# Patient Record
Sex: Male | Born: 1945 | Race: White | Hispanic: No | Marital: Married | Smoking: Never smoker
Health system: Southern US, Community
[De-identification: ages and names within clinical notes are randomized; demographics above are authoritative.]

## PROBLEM LIST (undated history)

## (undated) DIAGNOSIS — I10 Essential (primary) hypertension: Secondary | ICD-10-CM

## (undated) DIAGNOSIS — I4891 Unspecified atrial fibrillation: Secondary | ICD-10-CM

## (undated) DIAGNOSIS — Z87442 Personal history of urinary calculi: Secondary | ICD-10-CM

## (undated) DIAGNOSIS — T7840XA Allergy, unspecified, initial encounter: Secondary | ICD-10-CM

## (undated) DIAGNOSIS — J45909 Unspecified asthma, uncomplicated: Secondary | ICD-10-CM

## (undated) DIAGNOSIS — I5032 Chronic diastolic (congestive) heart failure: Secondary | ICD-10-CM

## (undated) DIAGNOSIS — C801 Malignant (primary) neoplasm, unspecified: Secondary | ICD-10-CM

## (undated) DIAGNOSIS — I499 Cardiac arrhythmia, unspecified: Secondary | ICD-10-CM

## (undated) DIAGNOSIS — E66811 Obesity, class 1: Secondary | ICD-10-CM

## (undated) DIAGNOSIS — I509 Heart failure, unspecified: Secondary | ICD-10-CM

## (undated) DIAGNOSIS — R609 Edema, unspecified: Secondary | ICD-10-CM

## (undated) HISTORY — DX: Unspecified atrial fibrillation: I48.91

## (undated) HISTORY — PX: OTHER SURGICAL HISTORY: SHX169

## (undated) HISTORY — DX: Allergy, unspecified, initial encounter: T78.40XA

## (undated) HISTORY — DX: Unspecified asthma, uncomplicated: J45.909

## (undated) HISTORY — DX: Heart failure, unspecified: I50.9

## (undated) HISTORY — PX: TONSILLECTOMY AND ADENOIDECTOMY: SUR1326

## (undated) HISTORY — DX: Essential (primary) hypertension: I10

---

## 2010-06-02 ENCOUNTER — Emergency Department (HOSPITAL_COMMUNITY): Admission: EM | Admit: 2010-06-02 | Discharge: 2010-06-02 | Payer: Self-pay | Admitting: Emergency Medicine

## 2010-12-22 LAB — URINALYSIS, ROUTINE W REFLEX MICROSCOPIC
Glucose, UA: NEGATIVE mg/dL
Ketones, ur: NEGATIVE mg/dL
Protein, ur: 30 mg/dL — AB
Urobilinogen, UA: 1 mg/dL (ref 0.0–1.0)
pH: 6.5 (ref 5.0–8.0)

## 2010-12-22 LAB — URINE CULTURE: Colony Count: NO GROWTH

## 2010-12-22 LAB — BASIC METABOLIC PANEL
CO2: 28 mEq/L (ref 19–32)
Chloride: 101 mEq/L (ref 96–112)
Creatinine, Ser: 0.73 mg/dL (ref 0.4–1.5)
GFR calc non Af Amer: >60 mL/min (ref >60)

## 2010-12-22 LAB — URINE MICROSCOPIC-ADD ON

## 2010-12-22 LAB — DIFFERENTIAL
Eosinophils Relative: 0 % (ref 0–5)
Monocytes Absolute: 0.3 10*3/uL (ref 0.1–1.0)

## 2010-12-22 LAB — CBC
HCT: 32.8 % — ABNORMAL LOW (ref 39.0–52.0)
MCH: 32.4 pg (ref 26.0–34.0)
WBC: 9.4 10*3/uL (ref 4.0–10.5)

## 2012-01-23 ENCOUNTER — Ambulatory Visit (INDEPENDENT_AMBULATORY_CARE_PROVIDER_SITE_OTHER): Payer: Medicare Other | Admitting: Family Medicine

## 2012-01-23 VITALS — BP 171/98 | HR 73 | Temp 97.5°F | Resp 18 | Ht 69.0 in | Wt 230.0 lb

## 2012-01-23 DIAGNOSIS — D649 Anemia, unspecified: Secondary | ICD-10-CM

## 2012-01-23 DIAGNOSIS — I10 Essential (primary) hypertension: Secondary | ICD-10-CM

## 2012-01-23 DIAGNOSIS — D509 Iron deficiency anemia, unspecified: Secondary | ICD-10-CM

## 2012-01-23 DIAGNOSIS — E663 Overweight: Secondary | ICD-10-CM

## 2012-01-23 DIAGNOSIS — M25569 Pain in unspecified knee: Secondary | ICD-10-CM

## 2012-01-23 LAB — POCT CBC
MCH, POC: 30.2 pg (ref 27–31.2)
MCHC: 32 g/dL (ref 31.8–35.4)
MCV: 94.2 fL (ref 80–97)
POC Granulocyte: 3.4 (ref 2–6.9)
POC LYMPH PERCENT: 30.2 %L (ref 10–50)
Platelet Count, POC: 209 10*3/uL (ref 142–424)
RBC: 4.37 M/uL — AB (ref 4.69–6.13)
RDW, POC: 13.6 %

## 2012-01-23 MED ORDER — LISINOPRIL-HYDROCHLOROTHIAZIDE 10-12.5 MG PO TABS: 1.0000 | ORAL_TABLET | Freq: Every day | ORAL | Status: DC

## 2012-01-23 NOTE — Progress Notes (Signed)
Patient Name: Daniel Underwood Date of Birth: 08/17/1946 Medical Record Number: [id number] Gender: male Date of Encounter: 01/23/2012  History of Present Illness:  Daniel Underwood is a 66 y.o. very pleasant male patient who presents with the following:  Here today to discuss two issues: he took his BP the other day at a friend's home and it was about 170/ 107.  He has never been treated or formally diagnosed with HTN in the past.  His mother had a history of HTN.  He had some syncopal episoedes  In the past- was diagnosed with a vagal response.  He was put on dilatin but he stopped taking this years ago.  He has not had a syncopal episode in about 20 years.    Daniel Underwood has been line- dancing a lot recently and developed some left knee pain.  He had tried some OTC medications.  He notes that his knee hurts once in a while now- he is not dancing as much now.  No locking, no instability.  He uses ibuprofen sometimes for this pain.  Daniel Underwood also admits that he has put on 20- 25 lbs over the last couple of years.  He is not exercising as much.  He is aware that his weight gain is not helping his BP or his knee  There is no problem list on file for this patient.  No past medical history on file. No past surgical history on file. History  Substance Use Topics  . Smoking status: Never Smoker   . Smokeless tobacco: Not on file  . Alcohol Use: Not on file   No family history on file. Allergies  Allergen Reactions  . Aleve Other (See Comments)    Sweating and rapid heart beat    Medication list has been reviewed and updated.  Review of Systems: As per HPI- otherwise negative. Of note no CP, no SOB  Physical Examination: Filed Vitals:   01/23/12 0933  BP: 171/98  Pulse: 73  Temp: 97.5 F (36.4 C)  TempSrc: Oral  Resp: 18  Height: 5\' 9"  (1.753 m)  Weight: 230 lb (104.327 kg)    Body mass index is 33.96 kg/(m^2).  GEN: WDWN, NAD, Non-toxic, A & O x 3, obese HEENT: Atraumatic, Normocephalic.  Neck supple. No masses, No LAD.  TM, oropharynx wnl, PEERL Ears and Nose: No external deformity. CV: RRR, No M/G/R. No JVD. No thrill. No extra heart sounds. PULM: CTA B, no wheezes, crackles, rhonchi. No retractions. No resp. distress. No accessory muscle use. ABD: S, NT, ND, +BS. No rebound. No HSM. Knees: no swelling or crepitus bilaterally, no effusion.  Left knee in particular is stable, no ligamentous instability EXTR: No c/c/e NEURO Normal gait.  PSYCH: Normally interactive. Conversant. Not depressed or anxious appearing.  Calm demeanor. ;   Results for orders placed in visit on 01/23/12  POCT CBC      Component Value Range   WBC 5.4  4.6 - 10.2 (K/uL)   Lymph, poc 1.6  0.6 - 3.4    POC LYMPH PERCENT 30.2  10 - 50 (%L)   MID (cbc) 0.4  0 - 0.9    POC MID % 6.8  0 - 12 (%M)   POC Granulocyte 3.4  2 - 6.9    Granulocyte percent 63.0  37 - 80 (%G)   RBC 4.37 (*) 4.69 - 6.13 (M/uL)   Hemoglobin 13.2 (*) 14.1 - 18.1 (g/dL)   HCT, POC 40.9 (*) 81.1 - 53.7 (%)  MCV 94.2  80 - 97 (fL)   MCH, POC 30.2  27 - 31.2 (pg)   MCHC 32.0  31.8 - 35.4 (g/dL)   RDW, POC 06.3     Platelet Count, POC 209  142 - 424 (K/uL)   MPV 8.9  0 - 99.8 (fL)   EKG: sinus rhythm, no concerning changes Assessment and Plan: 1. Essential hypertension, benign  EKG 12-Lead, POCT CBC, Comprehensive metabolic panel, lisinopril-hydrochlorothiazide (PRINZIDE,ZESTORETIC) 10-12.5 MG per tablet  2. Overweight    3. Knee pain    4. Anemia     Start lisinopril/ hctz for HTN.  Also urged him to work on losing the 20 lbs he has gained as this may resolve his HTN.  He had normal BP when he weighed about 205 lbs.  Asked Daniel Underwood to check his BP at home (he has his own cuff) a few times weekly and come in for a recheck in about 3 weeks.   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  Suggest tylenol as opposed to ibuprofen due to HTN.  Weight loss will help here too Anemia, mild: never had colonoscopy.  Urged him to arrange this test asap and gave  some community resources. Explained risk of colon cancer and benefit of early detection.

## 2012-01-23 NOTE — Patient Instructions (Addendum)
Wear and Tear Disorders of the Knee (Arthritis, Osteoarthritis) Everyone will experience wear and tear injuries (arthritis, osteoarthritis) of the knee. These are the changes we all get as we age. They come from the joint stress of daily living. The amount of cartilage damage in your knee and your symptoms determine if you need surgery. Mild problems require approximately two months recovery time. More severe problems take several months to recover. With mild problems, your surgeon may find worn and rough cartilage surfaces. With severe changes, your surgeon may find cartilage that has completely worn away and exposed the bone. Loose bodies of bone and cartilage, bone spurs (excess bone growth), and injuries to the menisci (cushions between the large bones of your leg) are also common. All of these problems can cause pain. For a mild wear and tear problem, rough cartilage may simply need to be shaved and smoothed. For more severe problems with areas of exposed bone, your surgeon may use an instrument for roughing up the bone surfaces to stimulate new cartilage growth. Loose bodies are usually removed. Torn menisci may be trimmed or repaired. ABOUT THE ARTHROSCOPIC PROCEDURE Arthroscopy is a surgical technique. It allows your orthopedic surgeon to diagnose and treat your knee injury with accuracy. The surgeon looks into your knee through a small scope. The scope is like a small (pencil-sized) telescope. Arthroscopy is less invasive than open knee surgery. You can expect a more rapid recovery. After the procedure, you will be moved to a recovery area until most of the effects of the medication have worn off. Your caregiver will discuss the test results with you. RECOVERY The severity of the arthritis and the type of procedure performed will determine recovery time. Other important factors include age, physical condition, medical conditions, and the type of rehabilitation program. Strengthening your muscles after  arthroscopy helps guarantee a better recovery. Follow your caregiver's instructions. Use crutches, rest, elevate, ice, and do knee exercises as instructed. Your caregivers will help you and instruct you with exercises and other physical therapy required to regain your mobility, muscle strength, and functioning following surgery. Only take over-the-counter or prescription medicines for pain, discomfort, or fever as directed by your caregiver.  SEEK MEDICAL CARE IF:   There is increased bleeding (more than a small spot) from the wound.   You notice redness, swelling, or increasing pain in the wound.   Pus is coming from wound.   You develop an unexplained oral temperature above 102 F (38.9 C) , or as your caregiver suggests.   You notice a foul smell coming from the wound or dressing.   You have severe pain with motion of the knee.  SEEK IMMEDIATE MEDICAL CARE IF:   You develop a rash.   You have difficulty breathing.   You have any allergic problems.  MAKE SURE YOU:   Understand these instructions.   Will watch your condition.   Will get help right away if you are not doing well or get worse.  Document Released: 09/21/2000 Document Revised: 09/13/2011 Document Reviewed: 02/18/2008 [facility] Patient Information 2012 [place], [place].   Please start your blood pressure medication that we prescribed for you today.  Check your BP with your home cuff 2 or 3 times per week.  Write down your readings and come see [place] in about 3 weeks.    You should schedule a colonoscopy to help screen for and prevent colon cancer.  Any GI doctor in town can help you with this.  Some good options  are [facility] or [facility] (Dr. Loreta Ave and Dr. Elnoria Howard)

## 2012-01-24 ENCOUNTER — Encounter: Payer: Self-pay | Admitting: Family Medicine

## 2012-01-24 LAB — COMPREHENSIVE METABOLIC PANEL
Albumin: 4 g/dL (ref 3.5–5.2)
CO2: 21 mEq/L (ref 19–32)
Chloride: 107 mEq/L (ref 96–112)
Potassium: 4.2 mEq/L (ref 3.5–5.3)
Total Protein: 6.9 g/dL (ref 6.0–8.3)

## 2012-02-05 NOTE — Progress Notes (Signed)
Spoke with patient and let him know that ekg was normal.

## 2012-10-08 DIAGNOSIS — I482 Chronic atrial fibrillation, unspecified: Secondary | ICD-10-CM

## 2012-10-08 HISTORY — DX: Chronic atrial fibrillation, unspecified: I48.20

## 2013-01-27 ENCOUNTER — Ambulatory Visit (INDEPENDENT_AMBULATORY_CARE_PROVIDER_SITE_OTHER): Payer: Medicare Other | Admitting: Family Medicine

## 2013-01-27 ENCOUNTER — Ambulatory Visit: Payer: Medicare Other

## 2013-01-27 ENCOUNTER — Telehealth: Payer: Self-pay

## 2013-01-27 VITALS — BP 132/94 | HR 86 | Temp 98.0°F | Resp 16 | Ht 69.5 in | Wt 235.6 lb

## 2013-01-27 DIAGNOSIS — I4891 Unspecified atrial fibrillation: Secondary | ICD-10-CM

## 2013-01-27 DIAGNOSIS — M25569 Pain in unspecified knee: Secondary | ICD-10-CM

## 2013-01-27 DIAGNOSIS — M25561 Pain in right knee: Secondary | ICD-10-CM

## 2013-01-27 DIAGNOSIS — I499 Cardiac arrhythmia, unspecified: Secondary | ICD-10-CM

## 2013-01-27 LAB — POCT CBC
Granulocyte percent: 58.3 %G (ref 37–80)
MPV: 8.4 fL (ref 0–99.8)
POC LYMPH PERCENT: 31.9 %L (ref 10–50)
Platelet Count, POC: 232 10*3/uL (ref 142–424)
RBC: 4.71 M/uL (ref 4.69–6.13)
RDW, POC: 14.5 %
WBC: 7.2 10*3/uL (ref 4.6–10.2)

## 2013-01-27 LAB — POCT GLYCOSYLATED HEMOGLOBIN (HGB A1C): Hemoglobin A1C: 5.5

## 2013-01-27 LAB — BASIC METABOLIC PANEL
CO2: 28 mEq/L (ref 19–32)
Chloride: 106 mEq/L (ref 96–112)
Creat: 0.71 mg/dL (ref 0.50–1.35)

## 2013-01-27 MED ORDER — TRAMADOL HCL 50 MG PO TABS: 50.0000 mg | ORAL_TABLET | Freq: Two times a day (BID) | ORAL | Status: DC | PRN

## 2013-01-27 MED ORDER — ASPIRIN 81 MG PO TBEC: 81.0000 mg | DELAYED_RELEASE_TABLET | Freq: Every day | ORAL | Status: DC

## 2013-01-27 NOTE — Progress Notes (Signed)
[facility] [address] [place] [address] [id number]  Date:  01/27/2013   Name:  Daniel Underwood   DOB:  1946-05-07   MRN:  [id number]  PCP:  Dois Davenport., MD    Chief Complaint: Knee Pain   History of Present Illness:  Daniel Underwood is a 67 y.o. very pleasant male patient who presents with the following:  He is here with left knee pain today.  He has noted this for about one year.  He has used some buffered aspirin and some topical cream, which helps some.  Over the last week he has had more pain than usual. It was especially bad a few days ago.  It is slightly swolllen.   No fall or other acute injury that he can recall. He has noted some clicking, but no instability.   He has never had any knee surgery  He has no history of atrial fibrillation in the past, no past cardiac problems.  Noted irregular rhythm on exam- he was not aware of any problem.  No CP, no syncope, no palpitations.   I put him on BP medication last year, but he did not take it for long.  Her reports he has been told he had pre- diabetes in the past, but never diagnosed with DM.    He reports an allergy to naproxen, but is able to tolerate ibuprofen and aspirin without a problem. He has occasional asthma, but no current symtoms.  He rarely uses his inhaler.  "Aspirin is what I take for pain" in general  There is no problem list on file for this patient.   Past Medical History  Diagnosis Date  . Allergy     History reviewed. No pertinent past surgical history.  History  Substance Use Topics  . Smoking status: Never Smoker   . Smokeless tobacco: Not on file  . Alcohol Use: Not on file    Family History  Problem Relation Age of Onset  . Hypertension Mother     Allergies  Allergen Reactions  . Naproxen Sodium Other (See Comments)    Sweating and rapid heart beat    Medication list has been reviewed and updated.  Current Outpatient Prescriptions on File Prior to  Visit  Medication Sig Dispense Refill  . fluticasone-salmeterol (ADVAIR HFA) 115-21 MCG/ACT inhaler Inhale 2 puffs into the lungs as needed.       Marland Kitchen lisinopril-hydrochlorothiazide (PRINZIDE,ZESTORETIC) 10-12.5 MG per tablet Take 1 tablet by mouth daily.  30 tablet  6   No current facility-administered medications on file prior to visit.    Review of Systems:  As per HPI- otherwise negative.   Physical Examination: Filed Vitals:   01/27/13 1157  BP: 132/94  Pulse: 86  Temp: 98 F (36.7 C)  Resp: 16   Filed Vitals:   01/27/13 1157  Height: 5' 9.5" (1.765 m)  Weight: 235 lb 9.6 oz (106.867 kg)   Body mass index is 34.3 kg/(m^2). Ideal Body Weight: Weight in (lb) to have BMI = 25: 171.4  GEN: WDWN, NAD, Non-toxic, A & O x 3, obese HEENT: Atraumatic, Normocephalic. Neck supple. No masses, No LAD. Ears and Nose: No external deformity. CV: irregularly irregular rhythm, No M/G/R. No JVD. No thrill. No extra heart sounds. PULM: CTA B, no wheezes, crackles, rhonchi. No retractions. No resp. distress. No accessory muscle use. ABD: S, NT, ND. No rebound. No HSM. EXTR: No c/c/e NEURO Normal gait.  PSYCH: Normally interactive.  Conversant. Not depressed or anxious appearing.  Calm demeanor.  Left knee: minimal medial joint line tenderness.  He has some pain with flexion and extension of the joint- mild, does not suggest gout or infection. Good ROM. No heat or redness.  Minimal to no swelling.  Joint is stable, no laxity  [facility] reading (PRIMARY) by  Dr. Patsy Lager. Left knee: mild degenerative change  LEFT KNEE - COMPLETE 4+ VIEW  Comparison: None.  Findings: There is slight medial joint space narrowing and there is slight marginal spur formation on the patella. No joint effusion. Probable small meniscal ossicle at the lateral aspect of the lateral joint space.  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   [facility] reading (PRIMARY) by   Dr. Patsy Lager. CXR: cardiomegaly, otherwise negative  CHEST - 2 VIEW  Comparison: 06/02/2010  Findings: The heart is mildly enlarged in size. The lungs are clear bilaterally. No bony abnormality is seen.  IMPRESSION: Stable cardiomegaly. No acute abnormality is noted.   Results for orders placed in visit on 01/27/13  POCT CBC      Result Value Range   WBC 7.2  4.6 - 10.2 K/uL   Lymph, poc 2.3  0.6 - 3.4   POC LYMPH PERCENT 31.9  10 - 50 %L   MID (cbc) 0.7  0 - 0.9   POC MID % 9.8  0 - 12 %M   POC Granulocyte 4.2  2 - 6.9   Granulocyte percent 58.3  37 - 80 %G   RBC 4.71  4.69 - 6.13 M/uL   Hemoglobin 14.2  14.1 - 18.1 g/dL   HCT, POC 78.2  95.6 - 53.7 %   MCV 97.0  80 - 97 fL   MCH, POC 30.1  27 - 31.2 pg   MCHC 31.1 (*) 31.8 - 35.4 g/dL   RDW, POC 21.3     Platelet Count, POC 232  142 - 424 K/uL   MPV 8.4  0 - 99.8 fL  POCT GLYCOSYLATED HEMOGLOBIN (HGB A1C)      Result Value Range   Hemoglobin A1C 5.5    GLUCOSE, POCT (MANUAL RESULT ENTRY)      Result Value Range   POC Glucose 87  70 - 99 mg/dl   EKG: atrial fibrillation with ventricular rate in the 70s  Discussed with Dr. Patty Sermons, DOD at [facility].  Daniel Underwood is in a fib with normal rate.  His CHADS2 score is zero. Will start on aspirin 81 mg a day- per pt he does NOT have any allergy to this medication. Will refer for follow- up with cardiology and for an echo.   Other labs pending as below  Assessment and Plan: Right knee pain - Plan: DG Knee Complete 4 Views Left, traMADol (ULTRAM) 50 MG tablet  Irregular heart rate - Plan: EKG 12-Lead  Atrial fibrillation - Plan: POCT CBC, TSH, Basic metabolic panel, POCT glycosylated hemoglobin (Hb A1C), Protime-INR, POCT glucose (manual entry), DG Chest 2 View, 2D Echocardiogram without contrast, Ambulatory referral to Cardiology, aspirin 81 MG EC tablet  Left knee pain- suspect pain due to mild OA, possible meniscal pathology.  Placed in hinged knee brace, ice and  elevate leg.  Use tramadol as needed for pain in the knee.  Cautioned regarding sedation with this medication.  Discussed need to avoid use if asthma symptoms.    New diagnosis/ incidental finding of atrial fibrillation.  Discussed in detail with patient. Will start on asa 81 mg, and  refer to cardiology and for echo as above.  If any changes in his symptoms he is to seek care right away.    Signed Abbe Amsterdam, MD

## 2013-01-27 NOTE — Telephone Encounter (Signed)
Pt was suppose to let Dr. Patsy Lager the name of a Dr. The Dr. Name is Marolyn Haller or Dickie La at [place]. If she has any questions he said for her to give him a call.

## 2013-01-27 NOTE — Patient Instructions (Addendum)
I will give you a call in a little while- please decide which cardiologist you would like for me to call.  We will then decide on a blood thinner for you. You may use the tramadol as needed for pain unless I tell you that we need to change this to something else.    You are in atrial fibrillation- this is the name of your cardiac rhythm abnormality

## 2013-01-27 NOTE — Telephone Encounter (Signed)
Dr Brodie 

## 2013-01-28 ENCOUNTER — Encounter: Payer: Self-pay | Admitting: Family Medicine

## 2013-01-28 ENCOUNTER — Telehealth: Payer: Self-pay | Admitting: Family Medicine

## 2013-01-28 LAB — PROTIME-INR: Prothrombin Time: 12.9 seconds (ref 11.6–15.2)

## 2013-01-28 NOTE — Telephone Encounter (Signed)
appt is set for tomorrow.  Referral is contacting pt

## 2013-01-29 ENCOUNTER — Ambulatory Visit (INDEPENDENT_AMBULATORY_CARE_PROVIDER_SITE_OTHER): Payer: Medicare Other | Admitting: Cardiology

## 2013-01-29 ENCOUNTER — Ambulatory Visit (HOSPITAL_COMMUNITY): Payer: Medicare Other | Attending: Cardiology | Admitting: Radiology

## 2013-01-29 ENCOUNTER — Encounter: Payer: Self-pay | Admitting: Cardiology

## 2013-01-29 VITALS — BP 142/90 | HR 72 | Ht 71.0 in | Wt 236.8 lb

## 2013-01-29 DIAGNOSIS — Z79899 Other long term (current) drug therapy: Secondary | ICD-10-CM

## 2013-01-29 DIAGNOSIS — I4891 Unspecified atrial fibrillation: Secondary | ICD-10-CM

## 2013-01-29 MED ORDER — RIVAROXABAN 20 MG PO TABS: 20.0000 mg | ORAL_TABLET | Freq: Every day | ORAL | Status: DC

## 2013-01-29 NOTE — Progress Notes (Signed)
Echocardiogram performed.  

## 2013-01-29 NOTE — Progress Notes (Signed)
HPI The patient recently saw his primary provider for evaluation of knee pain. He was noted to have an irregular rhythm. He had an EKG which demonstrated atrial fibrillation. He actually was not noticing this though in retrospect his girlfriend thinks that he had less tolerance for activities. He's not had any palpitations, presyncope or syncope. He's not had any new shortness of breath, PND or orthopnea. He's not had any weight gain or edema. He reports having a stress test years ago but no other cardiac history. Of note TSH and basic metabolic profile were normal. He did have an echocardiogram which I reviewed which demonstrates an EF of about 55% with no other significant abnormalities.  Allergies  Allergen Reactions  . Naproxen Sodium Other (See Comments)    Sweating and rapid heart beat    Current Outpatient Prescriptions  Medication Sig Dispense Refill  . aspirin 81 MG EC tablet Take 1 tablet (81 mg total) by mouth daily. Swallow whole.  30 tablet  12  . fluticasone-salmeterol (ADVAIR HFA) 115-21 MCG/ACT inhaler Inhale 2 puffs into the lungs as needed.       Marland Kitchen ibuprofen (ADVIL,MOTRIN) 200 MG tablet Take 200 mg by mouth every 6 (six) hours as needed for pain.      . traMADol (ULTRAM) 50 MG tablet Take 1 tablet (50 mg total) by mouth 2 (two) times daily as needed for pain.  30 tablet  1  . Rivaroxaban (XARELTO) 20 MG TABS Take 1 tablet (20 mg total) by mouth daily.  30 tablet  11   No current facility-administered medications for this visit.    Past Medical History  Diagnosis Date  . Asthma   . Atrial fibrillation     Past Surgical History  Procedure Laterality Date  . Tonsillectomy and adenoidectomy      Family History  Problem Relation Age of Onset  . Hypertension Mother   . Cancer Father     Lung  . CAD Brother 52    History   Social History  . Marital Status: Single    Spouse Name: N/A    Number of Children: 2  . Years of Education: N/A   Occupational  History  . Not on file.   Social History Main Topics  . Smoking status: Never Smoker   . Smokeless tobacco: Not on file  . Alcohol Use: Not on file  . Drug Use: No  . Sexually Active: Not on file   Other Topics Concern  . Not on file   Social History Narrative  . No narrative on file    ROS:  Positive for occasional wheezing and cough, leg cramping. Otherwise as stated in the HPI and negative for all other systems.  PHYSICAL EXAM BP 142/90  Pulse 72  Ht 5\' 11"  (1.803 m)  Wt 236 lb 12.8 oz (107.412 kg)  BMI 33.04 kg/m2 GENERAL:  Well appearing HEENT:  Pupils equal round and reactive, fundi not visualized, oral mucosa unremarkable NECK:  No jugular venous distention, waveform within normal limits, carotid upstroke brisk and symmetric, no bruits, no thyromegaly LYMPHATICS:  No cervical, inguinal adenopathy LUNGS:  Clear to auscultation bilaterally BACK:  No CVA tenderness CHEST:  Unremarkable HEART:  PMI not displaced or sustained,S1 and S2 within normal limits, no S3, no clicks, no rubs, no murmurs, irregular ABD:  Flat, positive bowel sounds normal in frequency in pitch, no bruits, no rebound, no guarding, no midline pulsatile mass, no hepatomegaly, no splenomegaly EXT:  2 plus pulses  throughout, no edema, no cyanosis no clubbing SKIN:  No rashes no nodules NEURO:  Cranial nerves II through XII grossly intact, motor grossly intact throughout PSYCH:  Cognitively intact, oriented to person place and time   EKG:  Atrial fibrillation, rate 70, leftward axis, RSR prime V1 and V2, no acute ST-T wave changes.  01/27/13  ASSESSMENT AND PLAN   ATRIAL FIB:  At this point he is having no symptoms. Daniel Underwood has a CHA2DS2 - VASc score of 1 with a risk of stroke of 1.3%  and a HAS - BLED score of 1 with a low risk of bleeding. Given this long term anticoagulation is not needed but I would like to try cardioversion to see if we can get him into sinus rhythm and perhaps he will  maintain his long term. Therefore, I will start him on Xarelto for 3 weeks and plan cardioversion. I will check a CBC. He has no contraindication to this rhythm.  HTN:  His blood pressure is only mildly elevated and this was probably related to knee pain and medications. At this point no change in therapy is indicated.

## 2013-01-29 NOTE — Patient Instructions (Addendum)
Please start Xarelto 20 mg a day. Continue all other medications as listed.  Please have blood work today (CBC)  Your physician has requested that you have a Cardioversion in 3 weeks.  Electrical Cardioversion uses a jolt of electricity to your heart either through paddles or wired patches attached to your chest. This is a controlled, usually prescheduled, procedure. This procedure is done at the hospital and you are not awake during the procedure. You usually go home the day of the procedure. Please see the instruction sheet given to you today for more information.  This will be scheduled at a later date.  Atrial Fibrillation Your caregiver has diagnosed you with atrial fibrillation (AFib). The heart normally beats very regularly; AFib is a type of irregular heartbeat. The heart rate may be faster or slower than normal. This can prevent your heart from pumping as well as it should. AFib can be constant (chronic) or intermittent (paroxysmal). CAUSES  Atrial fibrillation may be caused by:  Heart disease, including heart attack, coronary artery disease, heart failure, diseases of the heart valves, and others.  Blood clot in the lungs (pulmonary embolism).  Pneumonia or other infections.  Chronic lung disease.  Thyroid disease.  Toxins. These include alcohol, some medications (such as decongestant medications or diet pills), and caffeine. In some people, no cause for AFib can be found. This is referred to as Lone Atrial Fibrillation. SYMPTOMS   Palpitations or a fluttering in your chest.  A vague sense of chest discomfort.  Shortness of breath.  Sudden onset of lightheadedness or weakness. Sometimes, the first sign of AFib can be a complication of the condition. This could be a stroke or heart failure. DIAGNOSIS  Your description of your condition may make your caregiver suspicious of atrial fibrillation. Your caregiver will examine your pulse to determine if fibrillation is present.  An EKG (electrocardiogram) will confirm the diagnosis. Further testing may help determine what caused you to have atrial fibrillation. This may include chest x-ray, echocardiogram, blood tests, or CT scans. PREVENTION  If you have previously had atrial fibrillation, your caregiver may advise you to avoid substances known to cause the condition (such as stimulant medications, and possibly caffeine or alcohol). You may be advised to use medications to prevent recurrence. Proper treatment of any underlying condition is important to help prevent recurrence. PROGNOSIS  Atrial fibrillation does tend to become a chronic condition over time. It can cause significant complications (see below). Atrial fibrillation is not usually immediately life-threatening, but it can shorten your life expectancy. This seems to be worse in women. If you have lone atrial fibrillation and are under 49 years old, the risk of complications is very low, and life expectancy is not shortened. RISKS AND COMPLICATIONS  Complications of atrial fibrillation can include stroke, chest pain, and heart failure. Your caregiver will recommend treatments for the atrial fibrillation, as well as for any underlying conditions, to help minimize risk of complications. TREATMENT  Treatment for AFib is divided into several categories:  Treatment of any underlying condition.  Converting you out of AFib into a regular (sinus) rhythm.  Controlling rapid heart rate.  Prevention of blood clots and stroke. Medications and procedures are available to convert your atrial fibrillation to sinus rhythm. However, recent studies have shown that this may not offer you any advantage, and cardiac experts are continuing research and debate on this topic. More important is controlling your rapid heartbeat. The rapid heartbeat causes more symptoms, and places strain on your heart.  Your caregiver will advise you on the use of medications that can control your heart  rate. Atrial fibrillation is a strong stroke risk. You can lessen this risk by taking blood thinning medications such as Coumadin (warfarin), or sometimes aspirin. These medications need close monitoring by your caregiver. Over-medication can cause bleeding. Too little medication may not protect against stroke. HOME CARE INSTRUCTIONS   If your caregiver prescribed medicine to make your heartbeat more normally, take as directed.  If blood thinners were prescribed by your caregiver, take EXACTLY as directed.  Perform blood tests EXACTLY as directed.  Quit smoking. Smoking increases your cardiac and lung (pulmonary) risks.  DO NOT drink alcohol.  DO NOT drink caffeinated drinks (e.g. coffee, soda, chocolate, and leaf teas). You may drink decaffeinated coffee, soda or tea.  If you are overweight, you should choose a reduced calorie diet to lose weight. Please see a registered dietitian if you need more information about healthy weight loss. DO NOT USE DIET PILLS as they may aggravate heart problems.  If you have other heart problems that are causing AFib, you may need to eat a low salt, fat, and cholesterol diet. Your caregiver will tell you if this is necessary.  Exercise every day to improve your physical fitness. Stay active unless advised otherwise.  If your caregiver has given you a follow-up appointment, it is very important to keep that appointment. Not keeping the appointment could result in heart failure or stroke. If there is any problem keeping the appointment, you must call back to this facility for assistance. SEEK MEDICAL CARE IF:  You notice a change in the rate, rhythm or strength of your heartbeat.  You develop an infection or any other change in your overall health status. SEEK IMMEDIATE MEDICAL CARE IF:   You develop chest pain, abdominal pain, sweating, weakness or feel sick to your stomach (nausea).  You develop shortness of breath.  You develop swollen feet and  ankles.  You develop dizziness, numbness, or weakness of your face or limbs, or any change in vision or speech. MAKE SURE YOU:   Understand these instructions.  Will watch your condition.  Will get help right away if you are not doing well or get worse. Document Released: 09/24/2005 Document Revised: 12/17/2011 Document Reviewed: 04/28/2008 [facility] Patient Information 2013 [place], [place].

## 2013-01-30 LAB — CBC
MCHC: 34.1 g/dL (ref 30.0–36.0)
MCV: 93.4 fl (ref 78.0–100.0)
RBC: 4.29 Mil/uL (ref 4.22–5.81)
RDW: 13.6 % (ref 11.5–14.6)

## 2013-01-30 NOTE — Progress Notes (Signed)
Quick Note:  Preliminary report reviewed by triage nurse and sent to MD desk. ______ 

## 2013-02-02 ENCOUNTER — Telehealth: Payer: Self-pay | Admitting: Cardiology

## 2013-02-02 NOTE — Telephone Encounter (Signed)
New problem   Pt started on blood thinner last thurs 01/29/13 and has been experiencing having blood around his mouth in the mornings

## 2013-02-02 NOTE — Telephone Encounter (Signed)
Pt calling reporting he has woken with blood in his mouth since starting Xarelto.  He states that it looks like it is coming from his gums but that this morning he had a "pretty big lump of blood in his mouth."  Aware I will review with Dr Antoine Poche and call back with instructions.

## 2013-02-03 NOTE — Telephone Encounter (Signed)
I spoke with the patient and he will stop the Xarelto for now and see his dentist.  He will call me back when this has been addressed and we will consider restarting the Xarelto.  [facility] will need to be put on hold.

## 2014-07-23 DIAGNOSIS — Z0271 Encounter for disability determination: Secondary | ICD-10-CM

## 2015-09-14 ENCOUNTER — Encounter: Payer: Self-pay | Admitting: Family Medicine

## 2015-09-27 ENCOUNTER — Ambulatory Visit (INDEPENDENT_AMBULATORY_CARE_PROVIDER_SITE_OTHER): Payer: PPO | Admitting: Family Medicine

## 2015-09-27 ENCOUNTER — Encounter: Payer: Self-pay | Admitting: Family Medicine

## 2015-09-27 VITALS — BP 126/90 | HR 97 | Temp 97.7°F | Resp 16 | Ht 69.5 in | Wt 262.6 lb

## 2015-09-27 DIAGNOSIS — Z0000 Encounter for general adult medical examination without abnormal findings: Secondary | ICD-10-CM

## 2015-09-27 DIAGNOSIS — Z1211 Encounter for screening for malignant neoplasm of colon: Secondary | ICD-10-CM

## 2015-09-27 DIAGNOSIS — Z6838 Body mass index (BMI) 38.0-38.9, adult: Secondary | ICD-10-CM | POA: Diagnosis not present

## 2015-09-27 DIAGNOSIS — I482 Chronic atrial fibrillation, unspecified: Secondary | ICD-10-CM

## 2015-09-27 DIAGNOSIS — L989 Disorder of the skin and subcutaneous tissue, unspecified: Secondary | ICD-10-CM | POA: Diagnosis not present

## 2015-09-27 DIAGNOSIS — J302 Other seasonal allergic rhinitis: Secondary | ICD-10-CM

## 2015-09-27 DIAGNOSIS — Z23 Encounter for immunization: Secondary | ICD-10-CM

## 2015-09-27 DIAGNOSIS — J4520 Mild intermittent asthma, uncomplicated: Secondary | ICD-10-CM | POA: Diagnosis not present

## 2015-09-27 LAB — CBC
HEMATOCRIT: 43.4 % (ref 39.0–52.0)
Hemoglobin: 14.5 g/dL (ref 13.0–17.0)
MCH: 31.5 pg (ref 26.0–34.0)
MCHC: 33.4 g/dL (ref 30.0–36.0)
MCV: 94.1 fL (ref 78.0–100.0)
MPV: 10.7 fL (ref 8.6–12.4)
Platelets: 118 10*3/uL — ABNORMAL LOW (ref 150–400)
RBC: 4.61 MIL/uL (ref 4.22–5.81)
RDW: 13.2 % (ref 11.5–15.5)
WBC: 5.9 10*3/uL (ref 4.0–10.5)

## 2015-09-27 LAB — HEMOGLOBIN A1C
HEMOGLOBIN A1C: 5.7 % — AB (ref <5.7)
MEAN PLASMA GLUCOSE: 117 mg/dL — AB (ref <117)

## 2015-09-27 LAB — TSH: TSH: 1.821 u[IU]/mL (ref 0.350–4.500)

## 2015-09-27 LAB — COMPLETE METABOLIC PANEL WITH GFR
ALBUMIN: 4.3 g/dL (ref 3.6–5.1)
ALK PHOS: 83 U/L (ref 40–115)
ALT: 12 U/L (ref 9–46)
AST: 24 U/L (ref 10–35)
BUN: 19 mg/dL (ref 7–25)
CALCIUM: 9 mg/dL (ref 8.6–10.3)
CHLORIDE: 103 mmol/L (ref 98–110)
CO2: 25 mmol/L (ref 20–31)
CREATININE: 0.78 mg/dL (ref 0.70–1.18)
GFR, Est African American: >89 mL/min (ref >=60)
GFR, Est Non African American: >89 mL/min (ref >=60)
GLUCOSE: 101 mg/dL — AB (ref 65–99)
Potassium: 4.3 mmol/L (ref 3.5–5.3)
Sodium: 139 mmol/L (ref 135–146)
Total Bilirubin: 0.7 mg/dL (ref 0.2–1.2)
Total Protein: 7.1 g/dL (ref 6.1–8.1)

## 2015-09-27 LAB — LIPID PANEL
Cholesterol: 188 mg/dL (ref 125–200)
HDL: 44 mg/dL (ref >=40)
LDL CALC: 120 mg/dL (ref <130)
TRIGLYCERIDES: 119 mg/dL (ref <150)
Total CHOL/HDL Ratio: 4.3 Ratio (ref <=5.0)
VLDL: 24 mg/dL (ref <30)

## 2015-09-27 MED ORDER — FLUTICASONE-SALMETEROL 100-50 MCG/DOSE IN AEPB: 1.0000 | INHALATION_SPRAY | Freq: Two times a day (BID) | RESPIRATORY_TRACT | Status: DC

## 2015-09-27 MED ORDER — FLUTICASONE PROPIONATE 50 MCG/ACT NA SUSP: 2.0000 | Freq: Every day | NASAL | Status: DC

## 2015-09-27 NOTE — Progress Notes (Signed)
Subjective:    Patient ID: Daniel Underwood, male    DOB: [birth year], 69 y.o.   MRN: [id number]  HPI This is a pleasant 69 yo male who presents today for CPE. He has not had any regular care in several years. Has been feeling ok. He is married. He owns and maintains multiple rental properties.   Last CPE- several years PSA- several years ago  Colonoscopy- never Tdap- unknown  Flu- never Prevnar 62- will have today Dental- no regular care Eye- no  Exercise- He is very active managing his rental properties. Likes to walk. Goes to the beach most weekends.   He realizes he has gained quite a bit of weight in the last couple of years. Attributes this to not watching what he eats and not exercising regularly.   Past Medical History  Diagnosis Date  . Asthma   . Atrial fibrillation ([place])   . Allergy    Past Surgical History  Procedure Laterality Date  . Tonsillectomy and adenoidectomy    . Skin cancer, face     Family History  Problem Relation Age of Onset  . Hypertension Mother   . Heart disease Mother   . Cancer Father     Lung  . CAD Brother 62  . Cancer Brother     brain   Social History  Substance Use Topics  . Smoking status: Never Smoker   . Smokeless tobacco: None  . Alcohol Use: No    Review of Systems  Constitutional: Negative.   HENT: Positive for congestion.   Eyes: Negative.   Respiratory: Positive for cough (clears throat frequently, non productive.) and shortness of breath (occasionally with exertion).   Cardiovascular: Positive for palpitations (has history of afib for serveral years).  Gastrointestinal: Negative.   Endocrine: Negative.   Genitourinary: Negative.   Musculoskeletal: Negative.   Skin:       Growths on left ear  Allergic/Immunologic: Negative.   Neurological: Negative.   Hematological: Negative.   Psychiatric/Behavioral: Negative.        Objective:   Physical Exam Physical Exam  Constitutional: He is oriented to person, place,  and time. He appears well-developed and well-nourished.  HENT:  Head: Normocephalic and atraumatic.  Right Ear: External ear normal.  Left Ear: External ear normal.  Nose: Nose normal.  Mouth/Throat: Oropharynx is clear and moist.  Eyes: Conjunctivae are normal. Pupils are equal, round, and reactive to light.  Neck: Normal range of motion. Neck supple.  Cardiovascular: Normal rate, irregular rhythm, normal heart sounds and intact distal pulses.   Pulmonary/Chest: Effort normal and breath sounds normal.  Abdominal: Soft. Bowel sounds are normal. Hernia confirmed negative in the right inguinal area and confirmed negative in the left inguinal area.  Genitourinary: Testes normal and penis normal. Circumcised.  Musculoskeletal: Normal range of motion. He exhibits no edema or tenderness.       Cervical back: Normal.       Thoracic back: Normal.       Lumbar back: Normal.  Lymphadenopathy:    He has no cervical adenopathy.       Right: No inguinal adenopathy present.       Left: No inguinal adenopathy present.  Neurological: He is alert and oriented to person, place, and time. He has normal reflexes.  Skin: Skin is warm and dry. Left pinna with two areas of scaling, firm, raised lesions. Below left ear is approximately 1.5 cm raised, hyperpigmented lesion.  Psychiatric: He has a normal mood  and affect. His behavior is normal. Judgment normal.  Vitals reviewed.  BP 126/90 mmHg  Pulse 97  Temp(Src) 97.7 F (36.5 C) (Oral)  Resp 16  Ht 5' 9.5" (1.765 m)  Wt 262 lb 9.6 oz (119.115 kg)  BMI 38.24 kg/m2  SpO2 97% Wt Readings from Last 3 Encounters:  09/27/15 262 lb 9.6 oz (119.115 kg)  01/29/13 236 lb 12.8 oz (107.412 kg)  01/27/13 235 lb 9.6 oz (106.867 kg)  EKG- atrial fibrillation, rate 83 Office Spirometry Results: FEV1: 2.18 liters FVC: 3.26 liters FEV1/FVC: 66.9 % FVC  % Predicted: 75 liters FEV % Predicted: 69 liters FeF 25-75: 1.24 liters FeF 25-75 % Predicted: 52       Assessment & Plan:  1. Annual physical exam - Reviewed health maintenance recommendations including regular dental care, annual eye exam, regular exercise and healthy weight management.   2. Chronic atrial fibrillation ([place]) - Discussed with Dr. Everlene Farrier who reviewed EKG - Reviewed note from cardiology which recommended cardioversion in 2014. Long term anticoagulation was not recommended. Discussed this with the patient and encouraged him to follow up with cardiology. The patient said he will think about it. Will address at follow up visit.  - COMPLETE METABOLIC PANEL WITH GFR - CBC - TSH - EKG 12-Lead  3. BMI 38.0-38.9,adult - encouraged patient to decrease portions, limit eating out/fast foods, limit sweets and starchy foods.  - Lipid panel - Hemoglobin A1c  4. Asthma, mild intermittent, uncomplicated - patient was previously well maintained on Advair, discussed importance of using daily, will recheck PFTs at follow up visit.  - Fluticasone-Salmeterol (ADVAIR) 100-50 MCG/DOSE AEPB; Inhale 1 puff into the lungs 2 (two) times daily.  Dispense: 1 each; Refill: 3 - PFT PULM FXN SPIROMETRY (94010)  5. Need for prophylactic vaccination against Streptococcus pneumoniae (pneumococcus) - Pneumococcal conjugate vaccine 13-valent IM  6. Other seasonal allergic rhinitis - fluticasone (FLONASE) 50 MCG/ACT nasal spray; Place 2 sprays into both nostrils daily.  Dispense: 16 g; Refill: 6  7. Skin lesion - Ambulatory referral to Dermatology  8. Screening for colon cancer - discussed screening options with patient and he agrees to proceed with Cologuard testing with the understanding that if the test is positive, he will need follow up colonoscopy.   - follow up 3 months  Clarene Reamer, FNP-BC  [facility]  09/28/2015 8:53 PM

## 2015-09-27 NOTE — Patient Instructions (Addendum)
Please use prescription nasal spray every day at bedtime for 2-3 weeks Take an over the counter Zyrtec (generic is fine) Please schedule an appointment with your dentist We will call you about a dermatology appointment Consider going back to see Dr. Percival Spanish for your atrial fibrillation  Please work on weight loss- decrease portions, try to walk several times a week Aim for 1/2 pound per week     Why follow it? Research shows. . Those who follow the Mediterranean diet have a reduced risk of heart disease  . The diet is associated with a reduced incidence of Parkinson's and Alzheimer's diseases . People following the diet may have longer life expectancies and lower rates of chronic diseases  . The Dietary Guidelines for Americans recommends the Mediterranean diet as an eating plan to promote health and prevent disease  What Is the Mediterranean Diet?  . Healthy eating plan based on typical foods and recipes of Mediterranean-style cooking . The diet is primarily a plant based diet; these foods should make up a majority of meals   Starches - Plant based foods should make up a majority of meals - They are an important sources of vitamins, minerals, energy, antioxidants, and fiber - Choose whole grains, foods high in fiber and minimally processed items  - Typical grain sources include wheat, oats, barley, corn, brown rice, bulgar, farro, millet, polenta, couscous  - Various types of beans include chickpeas, lentils, fava beans, black beans, white beans   Fruits  Veggies - Large quantities of antioxidant rich fruits & veggies; 6 or more servings  - Vegetables can be eaten raw or lightly drizzled with oil and cooked  - Vegetables common to the traditional Mediterranean Diet include: artichokes, arugula, beets, broccoli, brussel sprouts, cabbage, carrots, celery, collard greens, cucumbers, eggplant, kale, leeks, lemons, lettuce, mushrooms, okra, onions, peas, peppers, potatoes, pumpkin, radishes,  rutabaga, shallots, spinach, sweet potatoes, turnips, zucchini - Fruits common to the Mediterranean Diet include: apples, apricots, avocados, cherries, clementines, dates, figs, grapefruits, grapes, melons, nectarines, oranges, peaches, pears, pomegranates, strawberries, tangerines  Fats - Replace butter and margarine with healthy oils, such as olive oil, canola oil, and tahini  - Limit nuts to no more than a handful a day  - Nuts include walnuts, almonds, pecans, pistachios, pine nuts  - Limit or avoid candied, honey roasted or heavily salted nuts - Olives are central to the [employer] - can be eaten whole or used in a variety of dishes   Meats Protein - Limiting red meat: no more than a few times a month - When eating red meat: choose lean cuts and keep the portion to the size of deck of cards - Eggs: approx. 0 to 4 times a week  - Fish and lean poultry: at least 2 a week  - Healthy protein sources include, chicken, [place], lean beef, lamb - Increase intake of seafood such as tuna, salmon, trout, mackerel, shrimp, scallops - Avoid or limit high fat processed meats such as sausage and bacon  Dairy - Include moderate amounts of low fat dairy products  - Focus on healthy dairy such as fat free yogurt, skim milk, low or reduced fat cheese - Limit dairy products higher in fat such as whole or 2% milk, cheese, ice cream  Alcohol - Moderate amounts of red wine is ok  - No more than 5 oz daily for women (all ages) and men older than age 1  - No more than 10 oz of wine daily for  men younger than 34  Other - Limit sweets and other desserts  - Use herbs and spices instead of salt to flavor foods  - Herbs and spices common to the traditional Mediterranean Diet include: basil, bay leaves, chives, cloves, cumin, fennel, garlic, lavender, marjoram, mint, oregano, parsley, pepper, rosemary, sage, savory, sumac, tarragon, thyme   It's not just a diet, it's a lifestyle:  . The Mediterranean diet  includes lifestyle factors typical of those in the region  . Foods, drinks and meals are best eaten with others and savored . Daily physical activity is important for overall good health . This could be strenuous exercise like running and aerobics . This could also be more leisurely activities such as walking, housework, yard-work, or taking the stairs . Moderation is the key; a balanced and healthy diet accommodates most foods and drinks . Consider portion sizes and frequency of consumption of certain foods   Meal Ideas & Options:  . Breakfast:  o Whole wheat toast or whole wheat English muffins with peanut butter & hard boiled egg o Steel cut oats topped with apples & cinnamon and skim milk  o Fresh fruit: banana, strawberries, melon, berries, peaches  o Smoothies: strawberries, bananas, greek yogurt, peanut butter o Low fat greek yogurt with blueberries and granola  o Egg white omelet with spinach and mushrooms o Breakfast couscous: whole wheat couscous, apricots, skim milk, cranberries  . Sandwiches:  o Hummus and grilled vegetables (peppers, zucchini, squash) on whole wheat bread   o Grilled chicken on whole wheat pita with lettuce, tomatoes, cucumbers or tzatziki  o Tuna salad on whole wheat bread: tuna salad made with greek yogurt, olives, red peppers, capers, green onions o Garlic rosemary lamb pita: lamb sauted with garlic, rosemary, salt & pepper; add lettuce, cucumber, greek yogurt to pita - flavor with lemon juice and black pepper  . Seafood:  o Mediterranean grilled salmon, seasoned with garlic, basil, parsley, lemon juice and black pepper o Shrimp, lemon, and spinach whole-grain pasta salad made with low fat greek yogurt  o Seared scallops with lemon orzo  o Seared tuna steaks seasoned salt, pepper, coriander topped with tomato mixture of olives, tomatoes, olive oil, minced garlic, parsley, green onions and cappers  . Meats:  o Herbed greek chicken salad with kalamata  olives, cucumber, feta  o Red bell peppers stuffed with spinach, bulgur, lean ground beef (or lentils) & topped with feta   o Kebabs: skewers of chicken, tomatoes, onions, zucchini, squash  o [place] burgers: made with red onions, mint, dill, lemon juice, feta cheese topped with roasted red peppers . Vegetarian o Cucumber salad: cucumbers, artichoke hearts, celery, red onion, feta cheese, tossed in olive oil & lemon juice  o Hummus and whole grain pita points with a greek salad (lettuce, tomato, feta, olives, cucumbers, red onion) o Lentil soup with celery, carrots made with vegetable broth, garlic, salt and pepper  o Tabouli salad: parsley, bulgur, mint, scallions, cucumbers, tomato, radishes, lemon juice, olive oil, salt and pepper.

## 2015-09-29 ENCOUNTER — Other Ambulatory Visit: Payer: Self-pay | Admitting: Family Medicine

## 2015-09-29 ENCOUNTER — Encounter: Payer: Self-pay | Admitting: Family Medicine

## 2015-09-29 DIAGNOSIS — D696 Thrombocytopenia, unspecified: Secondary | ICD-10-CM

## 2015-10-26 ENCOUNTER — Other Ambulatory Visit (INDEPENDENT_AMBULATORY_CARE_PROVIDER_SITE_OTHER): Payer: PPO

## 2015-10-26 DIAGNOSIS — D696 Thrombocytopenia, unspecified: Secondary | ICD-10-CM | POA: Diagnosis not present

## 2015-10-26 LAB — CBC WITH DIFFERENTIAL/PLATELET
BASOS PCT: 0 % (ref 0–1)
Basophils Absolute: 0 10*3/uL (ref 0.0–0.1)
Eosinophils Absolute: 0.2 10*3/uL (ref 0.0–0.7)
Eosinophils Relative: 3 % (ref 0–5)
HCT: 38.6 % — ABNORMAL LOW (ref 39.0–52.0)
HEMOGLOBIN: 13.2 g/dL (ref 13.0–17.0)
Lymphocytes Relative: 27 % (ref 12–46)
Lymphs Abs: 1.7 10*3/uL (ref 0.7–4.0)
MCH: 31.7 pg (ref 26.0–34.0)
MCHC: 34.2 g/dL (ref 30.0–36.0)
MCV: 92.8 fL (ref 78.0–100.0)
MPV: 10.4 fL (ref 8.6–12.4)
Monocytes Absolute: 0.6 10*3/uL (ref 0.1–1.0)
Monocytes Relative: 10 % (ref 3–12)
NEUTROS ABS: 3.8 10*3/uL (ref 1.7–7.7)
NEUTROS PCT: 60 % (ref 43–77)
Platelets: 189 10*3/uL (ref 150–400)
RBC: 4.16 MIL/uL — AB (ref 4.22–5.81)
RDW: 13.2 % (ref 11.5–15.5)
WBC: 6.4 10*3/uL (ref 4.0–10.5)

## 2015-11-01 ENCOUNTER — Telehealth: Payer: Self-pay

## 2015-11-01 NOTE — Telephone Encounter (Signed)
I have tried several times to fax the referral to [facility] in [place], but the faxes never go through. The fax number is verified. I am not sure how else to get the referral information to that office. Patient may need to call and schedule himself; I don't believe a referral is necessary for their office, per their website.  Phone: [phone]

## 2015-11-01 NOTE — Telephone Encounter (Signed)
Can we check on the status of this pt's Derm referral please. Thanks

## 2015-11-02 NOTE — Telephone Encounter (Signed)
Pt.notified

## 2015-11-25 DIAGNOSIS — Z1283 Encounter for screening for malignant neoplasm of skin: Secondary | ICD-10-CM | POA: Diagnosis not present

## 2015-11-25 DIAGNOSIS — D229 Melanocytic nevi, unspecified: Secondary | ICD-10-CM | POA: Diagnosis not present

## 2015-11-25 DIAGNOSIS — L821 Other seborrheic keratosis: Secondary | ICD-10-CM | POA: Diagnosis not present

## 2015-11-25 DIAGNOSIS — L570 Actinic keratosis: Secondary | ICD-10-CM | POA: Diagnosis not present

## 2015-11-25 DIAGNOSIS — L309 Dermatitis, unspecified: Secondary | ICD-10-CM | POA: Diagnosis not present

## 2015-11-25 DIAGNOSIS — Z85828 Personal history of other malignant neoplasm of skin: Secondary | ICD-10-CM | POA: Diagnosis not present

## 2015-11-25 DIAGNOSIS — Z08 Encounter for follow-up examination after completed treatment for malignant neoplasm: Secondary | ICD-10-CM | POA: Diagnosis not present

## 2015-11-25 DIAGNOSIS — L820 Inflamed seborrheic keratosis: Secondary | ICD-10-CM | POA: Diagnosis not present

## 2015-11-29 ENCOUNTER — Encounter: Payer: Self-pay | Admitting: Family Medicine

## 2015-11-29 ENCOUNTER — Ambulatory Visit (INDEPENDENT_AMBULATORY_CARE_PROVIDER_SITE_OTHER): Payer: PPO | Admitting: Family Medicine

## 2015-11-29 VITALS — BP 140/82 | HR 80 | Temp 98.1°F | Resp 18 | Ht 70.0 in | Wt 261.8 lb

## 2015-11-29 DIAGNOSIS — Z23 Encounter for immunization: Secondary | ICD-10-CM

## 2015-11-29 DIAGNOSIS — Z6837 Body mass index (BMI) 37.0-37.9, adult: Secondary | ICD-10-CM

## 2015-11-29 DIAGNOSIS — D649 Anemia, unspecified: Secondary | ICD-10-CM | POA: Diagnosis not present

## 2015-11-29 DIAGNOSIS — J4520 Mild intermittent asthma, uncomplicated: Secondary | ICD-10-CM

## 2015-11-29 LAB — CBC
HEMATOCRIT: 40.5 % (ref 39.0–52.0)
HEMOGLOBIN: 13.6 g/dL (ref 13.0–17.0)
MCH: 31.3 pg (ref 26.0–34.0)
MCHC: 33.6 g/dL (ref 30.0–36.0)
MCV: 93.3 fL (ref 78.0–100.0)
MPV: 11.3 fL (ref 8.6–12.4)
Platelets: 150 10*3/uL (ref 150–400)
RBC: 4.34 MIL/uL (ref 4.22–5.81)
RDW: 13.5 % (ref 11.5–15.5)
WBC: 6.1 10*3/uL (ref 4.0–10.5)

## 2015-11-29 MED ORDER — FLUTICASONE-SALMETEROL 250-50 MCG/DOSE IN AEPB: 1.0000 | INHALATION_SPRAY | Freq: Two times a day (BID) | RESPIRATORY_TRACT | Status: DC

## 2015-11-29 NOTE — Progress Notes (Signed)
Subjective:    Patient ID: Daniel Underwood, male    DOB: 1946-09-01, 70 y.o.   MRN: [id number]  HPI This is a pleasant 70 yo male who presents today for follow up of asthma, anemia. He has had some improvement in nasal symptoms with flonase. Has Cologuard collection kit at home and plans to send in this week. Was seen by his dermatologist last week and had cryotherapy to several areas. He has derm follow up in 6 months.   Discussed weight loss at last visit. Patient reports that he continues to be active and goes to the beach most weekends. Walks a lot. Eats out frequently, loves bread.   Past Medical History  Diagnosis Date  . Asthma   . Atrial fibrillation ([place])   . Allergy    Past Surgical History  Procedure Laterality Date  . Tonsillectomy and adenoidectomy    . Skin cancer, face     Family History  Problem Relation Age of Onset  . Hypertension Mother   . Heart disease Mother   . Cancer Father     Lung  . CAD Brother 64  . Cancer Brother     brain   Social History  Substance Use Topics  . Smoking status: Never Smoker   . Smokeless tobacco: Not on file  . Alcohol Use: No   Review of Systems No chest pain, no SOB, no palpitations    Objective:   Physical Exam Physical Exam  Constitutional: Oriented to person, place, and time. He appears well-developed and well-nourished.  HENT:  Head: Normocephalic and atraumatic.  Eyes: Conjunctivae are normal.  Neck: Normal range of motion. Neck supple.  Cardiovascular: Normal rate, regular rhythm and normal heart sounds.   Pulmonary/Chest: Effort normal and breath sounds normal.  Musculoskeletal: Normal range of motion.  Neurological: Alert and oriented to person, place, and time.  Skin: Skin is warm and dry.  Psychiatric: Normal mood and affect. Behavior is normal. Judgment and thought content normal.  Vitals reviewed.  BP 152/98 mmHg  Pulse 80  Temp(Src) 98.1 F (36.7 C) (Oral)  Resp 18  Ht _0  (1.778 m)  Wt 261  lb 12.8 oz (118.752 kg)  BMI 37.56 kg/m2  SpO2 97% Wt Readings from Last 3 Encounters:  11/29/15 261 lb 12.8 oz (118.752 kg)  09/27/15 262 lb 9.6 oz (119.115 kg)  01/29/13 236 lb 12.8 oz (107.412 kg)  Recheck BP 140/82 Today Office Spirometry Results: FEV1: 4.36 liters FVC: 3.65 liters FEV1/FVC: 119.5 % FVC  % Predicted: 84 liters FEV % Predicted: 83 liters FeF 25-75: 0.87 liters FeF 25-75 % Predicted: 36 12/20/16Office Spirometry Results: FEV1: 2.18 liters FVC: 3.26 liters FEV1/FVC: 66.9 % FVC % Predicted: 75 liters FEV % Predicted: 69 liters FeF 25-75: 1.24 liters FeF 25-75 % Predicted: 52     Assessment & Plan:  1. Asthma, mild intermittent, uncomplicated - some improvement in PFTs, will increase advair from 100-50 to 250-50 - PFT PULM FXN SPIROMETRY (94010) - Fluticasone-Salmeterol (ADVAIR DISKUS) 250-50 MCG/DOSE AEPB; Inhale 1 puff into the lungs 2 (two) times daily.  Dispense: 60 each; Refill: 5  2. Anemia, unspecified anemia type - CBC  3. Need for Tdap vaccination - Tdap vaccine greater than or equal to 7yo IM  4. Body mass index (BMI) of 37.0 to 37.9 in adult - encouraged him to watch diet and portions more carefully, avoid fried foods, sweet tea - provided information about Mediteranean Diet  - follow up in 6 months  Clarene Reamer, FNP-BC  [facility] [place] [facility]  11/29/2015 11:04 AM

## 2015-11-29 NOTE — Patient Instructions (Signed)

## 2015-12-01 ENCOUNTER — Encounter: Payer: Self-pay | Admitting: *Deleted

## 2016-01-18 LAB — COLOGUARD: COLOGUARD: NEGATIVE

## 2016-01-21 ENCOUNTER — Other Ambulatory Visit: Payer: Self-pay | Admitting: Physician Assistant

## 2016-05-29 DIAGNOSIS — L821 Other seborrheic keratosis: Secondary | ICD-10-CM | POA: Diagnosis not present

## 2016-05-29 DIAGNOSIS — D485 Neoplasm of uncertain behavior of skin: Secondary | ICD-10-CM | POA: Diagnosis not present

## 2016-05-29 DIAGNOSIS — C44319 Basal cell carcinoma of skin of other parts of face: Secondary | ICD-10-CM | POA: Diagnosis not present

## 2016-05-29 DIAGNOSIS — L570 Actinic keratosis: Secondary | ICD-10-CM | POA: Diagnosis not present

## 2016-07-03 DIAGNOSIS — C44319 Basal cell carcinoma of skin of other parts of face: Secondary | ICD-10-CM | POA: Diagnosis not present

## 2016-08-18 ENCOUNTER — Other Ambulatory Visit: Payer: Self-pay | Admitting: Family Medicine

## 2016-08-18 DIAGNOSIS — J4520 Mild intermittent asthma, uncomplicated: Secondary | ICD-10-CM

## 2016-09-11 DIAGNOSIS — R229 Localized swelling, mass and lump, unspecified: Secondary | ICD-10-CM | POA: Diagnosis not present

## 2016-09-11 DIAGNOSIS — L821 Other seborrheic keratosis: Secondary | ICD-10-CM | POA: Diagnosis not present

## 2016-09-11 DIAGNOSIS — C44229 Squamous cell carcinoma of skin of left ear and external auricular canal: Secondary | ICD-10-CM | POA: Diagnosis not present

## 2016-09-11 DIAGNOSIS — D485 Neoplasm of uncertain behavior of skin: Secondary | ICD-10-CM | POA: Diagnosis not present

## 2016-10-10 DIAGNOSIS — C44229 Squamous cell carcinoma of skin of left ear and external auricular canal: Secondary | ICD-10-CM | POA: Diagnosis not present

## 2016-11-28 ENCOUNTER — Other Ambulatory Visit: Payer: Self-pay | Admitting: Physician Assistant

## 2016-11-28 DIAGNOSIS — J4520 Mild intermittent asthma, uncomplicated: Secondary | ICD-10-CM

## 2017-01-14 ENCOUNTER — Telehealth: Payer: Self-pay | Admitting: Physician Assistant

## 2017-01-14 DIAGNOSIS — J4520 Mild intermittent asthma, uncomplicated: Secondary | ICD-10-CM

## 2017-01-14 MED ORDER — FLUTICASONE-SALMETEROL 250-50 MCG/DOSE IN AEPB: 1.0000 | INHALATION_SPRAY | Freq: Two times a day (BID) | RESPIRATORY_TRACT | 0 refills | Status: DC

## 2017-01-14 NOTE — Telephone Encounter (Signed)
PATIENT IS REQUESTING A REFILL ON HIS FLUTICASONE-SALMETEROL 250-50 MCG INHALER. HE DOES NOT HAVE ANY MORE REFILLS AND IS COMPLETELY OUT. HE WILL CALL BACK TOMORROW TO SCHEDULE A APPOINTMENT WITH CHELLE ON WED. 01/23/17. THE SCHEDULE IS NOT OPEN YET. BEST PHONE [phone] (CELL) PHARMACY CHOICE IS [facility]. [place]

## 2017-01-15 ENCOUNTER — Other Ambulatory Visit: Payer: Self-pay | Admitting: Emergency Medicine

## 2017-01-15 DIAGNOSIS — J4520 Mild intermittent asthma, uncomplicated: Secondary | ICD-10-CM

## 2017-01-15 MED ORDER — FLUTICASONE-SALMETEROL 250-50 MCG/DOSE IN AEPB: 1.0000 | INHALATION_SPRAY | Freq: Two times a day (BID) | RESPIRATORY_TRACT | 0 refills | Status: DC

## 2017-01-23 ENCOUNTER — Ambulatory Visit (INDEPENDENT_AMBULATORY_CARE_PROVIDER_SITE_OTHER): Payer: PPO | Admitting: Physician Assistant

## 2017-01-23 VITALS — BP 136/84 | HR 90 | Temp 98.7°F | Resp 16 | Ht 70.0 in | Wt 270.4 lb

## 2017-01-23 DIAGNOSIS — R8290 Unspecified abnormal findings in urine: Secondary | ICD-10-CM | POA: Diagnosis not present

## 2017-01-23 DIAGNOSIS — D649 Anemia, unspecified: Secondary | ICD-10-CM | POA: Diagnosis not present

## 2017-01-23 DIAGNOSIS — J4520 Mild intermittent asthma, uncomplicated: Secondary | ICD-10-CM | POA: Diagnosis not present

## 2017-01-23 DIAGNOSIS — R739 Hyperglycemia, unspecified: Secondary | ICD-10-CM

## 2017-01-23 DIAGNOSIS — I4891 Unspecified atrial fibrillation: Secondary | ICD-10-CM

## 2017-01-23 DIAGNOSIS — N3001 Acute cystitis with hematuria: Secondary | ICD-10-CM | POA: Insufficient documentation

## 2017-01-23 DIAGNOSIS — R319 Hematuria, unspecified: Secondary | ICD-10-CM | POA: Diagnosis not present

## 2017-01-23 DIAGNOSIS — R7309 Other abnormal glucose: Secondary | ICD-10-CM

## 2017-01-23 DIAGNOSIS — Z23 Encounter for immunization: Secondary | ICD-10-CM

## 2017-01-23 DIAGNOSIS — J302 Other seasonal allergic rhinitis: Secondary | ICD-10-CM | POA: Diagnosis not present

## 2017-01-23 DIAGNOSIS — J45909 Unspecified asthma, uncomplicated: Secondary | ICD-10-CM | POA: Insufficient documentation

## 2017-01-23 DIAGNOSIS — Z0000 Encounter for general adult medical examination without abnormal findings: Secondary | ICD-10-CM

## 2017-01-23 LAB — CBC WITH DIFFERENTIAL/PLATELET
Basophils Absolute: 0 10*3/uL (ref 0.0–0.2)
Basos: 0 %
EOS (ABSOLUTE): 0.2 10*3/uL (ref 0.0–0.4)
EOS: 4 %
HEMATOCRIT: 40.1 % (ref 37.5–51.0)
HEMOGLOBIN: 14 g/dL (ref 13.0–17.7)
Immature Grans (Abs): 0 10*3/uL (ref 0.0–0.1)
Immature Granulocytes: 0 %
LYMPHS: 23 %
Lymphocytes Absolute: 1.4 10*3/uL (ref 0.7–3.1)
MCH: 32.3 pg (ref 26.6–33.0)
MCHC: 34.9 g/dL (ref 31.5–35.7)
MCV: 93 fL (ref 79–97)
MONOCYTES: 10 %
MONOS ABS: 0.6 10*3/uL (ref 0.1–0.9)
Neutrophils Absolute: 3.9 10*3/uL (ref 1.4–7.0)
Neutrophils: 63 %
Platelets: 194 10*3/uL (ref 150–379)
RBC: 4.33 x10E6/uL (ref 4.14–5.80)
RDW: 13.4 % (ref 12.3–15.4)
WBC: 6.1 10*3/uL (ref 3.4–10.8)

## 2017-01-23 LAB — POCT URINALYSIS DIP (MANUAL ENTRY)
Bilirubin, UA: NEGATIVE
GLUCOSE UA: NEGATIVE mg/dL
Ketones, POC UA: NEGATIVE mg/dL
NITRITE UA: POSITIVE — AB
SPEC GRAV UA: 1.025 (ref 1.010–1.025)
UROBILINOGEN UA: 0.2 U/dL
pH, UA: 5 (ref 5.0–8.0)

## 2017-01-23 LAB — COMPREHENSIVE METABOLIC PANEL
ALT: 8 IU/L (ref 0–44)
AST: 21 IU/L (ref 0–40)
Albumin/Globulin Ratio: 1.4 (ref 1.2–2.2)
Albumin: 4.1 g/dL (ref 3.5–4.8)
Alkaline Phosphatase: 86 IU/L (ref 39–117)
BUN/Creatinine Ratio: 29 — ABNORMAL HIGH (ref 10–24)
BUN: 22 mg/dL (ref 8–27)
Bilirubin Total: 0.5 mg/dL (ref 0.0–1.2)
CALCIUM: 9.3 mg/dL (ref 8.6–10.2)
CO2: 22 mmol/L (ref 18–29)
CREATININE: 0.75 mg/dL — AB (ref 0.76–1.27)
Chloride: 102 mmol/L (ref 96–106)
GFR calc Af Amer: 105 mL/min/{1.73_m2} (ref >59)
GFR, EST NON AFRICAN AMERICAN: 91 mL/min/{1.73_m2} (ref >59)
GLOBULIN, TOTAL: 3 g/dL (ref 1.5–4.5)
Glucose: 112 mg/dL — ABNORMAL HIGH (ref 65–99)
Potassium: 4.2 mmol/L (ref 3.5–5.2)
SODIUM: 138 mmol/L (ref 134–144)
TOTAL PROTEIN: 7.1 g/dL (ref 6.0–8.5)

## 2017-01-23 LAB — HEMOGLOBIN A1C
Est. average glucose Bld gHb Est-mCnc: 114 mg/dL
Hgb A1c MFr Bld: 5.6 % (ref 4.8–5.6)

## 2017-01-23 MED ORDER — FLUTICASONE PROPIONATE 50 MCG/ACT NA SUSP: 2.0000 | Freq: Every day | NASAL | 12 refills | Status: AC

## 2017-01-23 MED ORDER — ZOSTER VAC RECOMB ADJUVANTED 50 MCG/0.5ML IM SUSR: 0.5000 mL | Freq: Once | INTRAMUSCULAR | 1 refills | Status: AC

## 2017-01-23 MED ORDER — FLUTICASONE-SALMETEROL 250-50 MCG/DOSE IN AEPB: 1.0000 | INHALATION_SPRAY | Freq: Two times a day (BID) | RESPIRATORY_TRACT | 12 refills | Status: DC

## 2017-01-23 MED ORDER — CIPROFLOXACIN HCL 500 MG PO TABS: 500.0000 mg | ORAL_TABLET | Freq: Two times a day (BID) | ORAL | 0 refills | Status: DC

## 2017-01-23 NOTE — Patient Instructions (Addendum)
Please schedule an eye exam. If you do not have a specialist, I recommend: [facility]  [address], [place], [place] [address]  Phone: [phone]  [facility] [place], [place], [place] [address]  Phone: [phone]  [place]. [address] [place], [place] [address]  Phone: [phone]      IF you received an x-ray today, you will receive an invoice from [facility]. Please contact [facility] at [phone] with questions or concerns regarding your invoice.   IF you received labwork today, you will receive an invoice from [place]. Please contact [facility] at [phone] with questions or concerns regarding your invoice.   Our billing staff will not be able to assist you with questions regarding bills from these companies.  You will be contacted with the lab results as soon as they are available. The fastest way to get your results is to activate your My Chart account. Instructions are located on the last page of this paperwork. If you have not heard from [place] regarding the results in 2 weeks, please contact this office.     Keeping you healthy  Get these tests  Blood pressure- Have your blood pressure checked once a year by your healthcare provider.  Normal blood pressure is 120/80  Weight- Have your body mass index (BMI) calculated to screen for obesity.  BMI is a measure of body fat based on height and weight. You can also calculate your own BMI at ViewBanking.si.  Cholesterol- Have your cholesterol checked every year.  Diabetes- Have your blood sugar checked regularly if you have high blood pressure, high cholesterol, have a family history of diabetes or if you are overweight.  Screening for Colon Cancer- Colonoscopy starting at age 22.  Screening may begin sooner depending on your family history and other health conditions. Follow up colonoscopy as directed by your Gastroenterologist.  Screening for Prostate  Cancer- Both blood work (PSA) and a rectal exam help screen for Prostate Cancer.  Screening begins at age 3 with African-American men and at age 74 with Caucasian men.  Screening may begin sooner depending on your family history.  Take these medicines  Aspirin- One aspirin daily can help prevent Heart disease and Stroke.  Flu shot- Every fall.  Tetanus- Every 10 years.  Zostavax- Once after the age of 71 to prevent Shingles.  Pneumonia shot- Once after the age of 55; if you are younger than 69, ask your healthcare provider if you need a Pneumonia shot.  Take these steps  Don't smoke- If you do smoke, talk to your doctor about quitting.  For tips on how to quit, go to www.smokefree.gov or call 1-800-QUIT-NOW.  Be physically active- Exercise 5 days a week for at least 30 minutes.  If you are not already physically active start slow and gradually work up to 30 minutes of moderate physical activity.  Examples of moderate activity include walking briskly, mowing the yard, dancing, swimming, bicycling, etc.  Eat a healthy diet- Eat a variety of healthy food such as fruits, vegetables, low fat milk, low fat cheese, yogurt, lean meant, poultry, fish, beans, tofu, etc. For more information go to www.thenutritionsource.org  Drink alcohol in moderation- Limit alcohol intake to less than two drinks a day. Never drink and drive.  Dentist- Brush and floss twice daily; visit your dentist twice a year.  Depression- Your emotional health is as important as your physical health. If you're feeling down, or losing interest in  things you would normally enjoy please talk to your healthcare provider.  Eye exam- Visit your eye doctor every year.  Safe sex- If you may be exposed to a sexually transmitted infection, use a condom.  Seat belts- Seat belts can save your life; always wear one.  Smoke/Carbon Monoxide detectors- These detectors need to be installed on the appropriate level of your home.  Replace  batteries at least once a year.  Skin cancer- When out in the sun, cover up and use sunscreen 15 SPF or higher.  Violence- If anyone is threatening you, please tell your healthcare provider.  Living Will/ Health care power of attorney- Speak with your healthcare provider and family.

## 2017-01-23 NOTE — Assessment & Plan Note (Signed)
Controlled. Stable. Continue current treatment. 

## 2017-01-23 NOTE — Progress Notes (Signed)
Patient ID: Daniel Underwood, male     DOB: Sep 29, 1946, 71 y.o.    MRN: [id number]  PCP: Harrison Mons, PA-C  Chief Complaint  Patient presents with  . Annual Exam   Patient Care Team: Harrison Mons, PA-C as PCP - General (Family Medicine) Minus Breeding, MD as Consulting Physician (Cardiology) Jannet Mantis, MD as Consulting Physician (Dermatology)   Subjective:   This patient is new to me and presents for Annual Wellness Visit. He was previously followed by Ms. Carlean Purl, who has left this office.  Functional Status Survey: Is the patient deaf or have difficulty hearing?: No Does the patient have difficulty seeing, even when wearing glasses/contacts?: No Does the patient have difficulty concentrating, remembering, or making decisions?: No Does the patient have difficulty walking or climbing stairs?: No Does the patient have difficulty dressing or bathing?: No Does the patient have difficulty doing errands alone such as visiting a doctor's office or shopping?: No  Depression screen [facility] 2/9 01/23/2017 11/29/2015 09/27/2015  Decreased Interest 0 0 0  Down, Depressed, Hopeless 0 0 0  PHQ - 2 Score 0 0 0    Fall Risk  01/23/2017 09/27/2015  Falls in the past year? No No   He does not have HCPOA nor living will.  Lives in a two story home, all the living areas are on one floor. Full basement. Adult daughters live in [place], [place].   Review of Systems  Constitutional: Negative.   HENT: Negative.   Eyes: Negative.   Respiratory: Negative.   Cardiovascular: Negative.   Gastrointestinal: Negative.   Endocrine: Negative.   Genitourinary: Negative.   Musculoskeletal: Negative.   Skin: Negative.   Allergic/Immunologic: Negative.   Neurological: Negative.   Hematological: Negative.   Psychiatric/Behavioral: Negative.      Prior to Admission medications   Medication Sig Start Date End Date Taking? Authorizing Provider  aspirin 81 MG EC tablet Take 1 tablet  (81 mg total) by mouth daily. Swallow whole. 01/27/13  Yes Gay Filler Copland, MD  Fluticasone-Salmeterol (ADVAIR DISKUS) 250-50 MCG/DOSE AEPB Inhale 1 puff into the lungs 2 (two) times daily. 01/15/17  Yes Reiley Keisler, PA-C  fluticasone (FLONASE) 50 MCG/ACT nasal spray Place 2 sprays into both nostrils daily. Patient not taking: Reported on 01/23/2017 09/27/15   Elby Beck, FNP  ibuprofen (ADVIL,MOTRIN) 200 MG tablet Take 200 mg by mouth every 6 (six) hours as needed for pain.    Historical Provider, MD     Allergies  Allergen Reactions  . Naproxen Sodium Other (See Comments)    Sweating and rapid heart beat     Patient Active Problem List   Diagnosis Date Noted  . Anemia 01/23/2017  . Asthma 01/23/2017  . Atrial fibrillation ([place]) 01/29/2013     Family History  Problem Relation Age of Onset  . Hypertension Mother   . Heart disease Mother   . Cancer Father     Lung  . Cancer Brother     brain  . Cancer Daughter     Lung/Bone  . CAD Brother 46     Social History   Social History  . Marital status: Married    Spouse name: Otila Kluver  . Number of children: 2  . Years of education: 12th grade   Occupational History  . Rental Property    Social History Main Topics  . Smoking status: Never Smoker  . Smokeless tobacco: Never Used  . Alcohol use No  . Drug use:  No  . Sexual activity: Not on file   Other Topics Concern  . Not on file   Social History Narrative   Married 2014 to [place]. Previous marriage ended in divorce.   2 adult daughters.   Exercise: No   Education: [employer].         Objective:  Physical Exam  Constitutional: He is oriented to person, place, and time. He appears well-developed and well-nourished. He is active and cooperative. No distress.  BP 136/84   Pulse 90   Temp 98.7 F (37.1 C) (Oral)   Resp 16   Ht 5\' 10"  (1.778 m)   Wt 270 lb 6.4 oz (122.7 kg)   SpO2 97%   BMI 38.80 kg/m   HENT:  Head: Normocephalic and atraumatic.   Right Ear: Hearing normal.  Left Ear: Hearing normal.  Eyes: Conjunctivae are normal. No scleral icterus.  Neck: Normal range of motion. Neck supple. No thyromegaly present.  Cardiovascular: Normal rate and normal heart sounds.  An irregularly irregular rhythm present.  Pulses:      Radial pulses are 2+ on the right side, and 2+ on the left side.  Pulmonary/Chest: Effort normal and breath sounds normal.  Lymphadenopathy:       Head (right side): No tonsillar, no preauricular, no posterior auricular and no occipital adenopathy present.       Head (left side): No tonsillar, no preauricular, no posterior auricular and no occipital adenopathy present.    He has no cervical adenopathy.       Right: No supraclavicular adenopathy present.       Left: No supraclavicular adenopathy present.  Neurological: He is alert and oriented to person, place, and time. No sensory deficit.  Skin: Skin is warm, dry and intact. No rash noted. No cyanosis or erythema. Nails show no clubbing.  Psychiatric: He has a normal mood and affect. His speech is normal and behavior is normal.        Results for orders placed or performed in visit on 01/23/17  POCT urinalysis dipstick  Result Value Ref Range   Color, UA yellow yellow   Clarity, UA cloudy (A) clear   Glucose, UA negative negative mg/dL   Bilirubin, UA negative negative   Ketones, POC UA negative negative mg/dL   Spec Grav, UA 1.025 1.010 - 1.025   Blood, UA large (A) negative   pH, UA 5.0 5.0 - 8.0   Protein Ur, POC trace (A) negative mg/dL   Urobilinogen, UA 0.2 0.2 or 1.0 E.U./dL   Nitrite, UA Positive (A) Negative   Leukocytes, UA Trace (A) Negative        Assessment & Plan:   Problem List Items Addressed This Visit    Atrial fibrillation ([place])    Remains asymptomatic. Rate controlled without medications. Continue ASA. Follow-up with cardiology.      Relevant Orders   Ambulatory referral to Cardiology   Anemia    Update CBC.        Relevant Orders   CBC with Differential/Platelet   Asthma    Controlled. Stable. Continue current treatment.        Relevant Medications   Fluticasone-Salmeterol (ADVAIR DISKUS) 250-50 MCG/DOSE AEPB    Other Visit Diagnoses    Annual physical exam    -  Primary   Elevated hemoglobin A1c       Update lab today. Consider metformin if A1C >7%   Relevant Orders   Hemoglobin A1c   Hyperglycemia  Relevant Orders   Comprehensive metabolic panel   Abnormal urine odor       nitrites present, so suspect UTI. Empiric treatment with Cipro. Await UCx.    Relevant Medications   ciprofloxacin (CIPRO) 500 MG tablet   Other Relevant Orders   POCT urinalysis dipstick (Completed)   Urine culture   Need for shingles vaccine       Relevant Medications   Zoster Vac Recomb Adjuvanted [facility]) injection   Hematuria, unspecified type       Treat for presumed UTI. Re-evaluate in 2 weeks.   Other seasonal allergic rhinitis       Resume Flonase. Use daily for maintenance.   Relevant Medications   fluticasone (FLONASE) 50 MCG/ACT nasal spray   Need for prophylactic vaccination against Streptococcus pneumoniae (pneumococcus)       Relevant Orders   Pneumococcal polysaccharide vaccine 23-valent greater than or equal to 2yo subcutaneous/IM (Completed)       Return in about 2 weeks (around 02/06/2017) for re-evaluation of blood in urine.   Fara Chute, PA-C [facility]

## 2017-01-23 NOTE — Progress Notes (Signed)
THIS NOTE IS USED FOR EDUCATIONAL PURPOSES ONLY!!!   Name: Daniel Underwood  DOB: [birth year]  Age: 71 y.o. Sex: male  Patient Care Team: Harrison Mons, PA-C as PCP - General (Family Medicine) Minus Breeding, MD as Consulting Physician (Cardiology) Jannet Mantis, MD as Consulting Physician (Dermatology)  CC:  Chief Complaint  Patient presents with  . Annual Exam    PCP: Harrison Mons, PA-C  HPI: Patient reports today for his annual exam.  Fall Risk  01/23/2017 09/27/2015  Falls in the past year? No No   Depression screen [facility] 2/9 01/23/2017 11/29/2015 09/27/2015  Decreased Interest 0 0 0  Down, Depressed, Hopeless 0 0 0  PHQ - 2 Score 0 0 0   Functional Status Survey: Is the patient deaf or have difficulty hearing?: No Does the patient have difficulty seeing, even when wearing glasses/contacts?: No Does the patient have difficulty concentrating, remembering, or making decisions?: No Does the patient have difficulty walking or climbing stairs?: No Does the patient have difficulty dressing or bathing?: No Does the patient have difficulty doing errands alone such as visiting a doctor's office or shopping?: No   Patient has a PMH of a fib. He reports he is currently asymptomatic today. He denies palpitations, tachycardia, SOB, CP. He reports he has seen a cardiologist for this before. He reports he saw Dr. Percival Spanish in 01/29/13 who recommended cardioversion. He reports he does not want to go back to cardiology because he is asymptomatic. Currently takes ASA 81 mg daily. CHADSVASC score: 1.   He report an change in smell with his urine over the past couple of months. He reports it felt "stronger". He Denies changes in frequency, urgency, hesitation, dysuria, hematuria.   Patient reports he is taking advair for his asthma symptoms and reports he doesn't think his medicine is "doing anything." Yet he reports he is not having any breakthrough symptoms though. Denies using his albuterol  inhaler because he doesn't need it.     Colorectal Cancer Screening: Current- due 01/18/19 HIV Screening: never completed- pt declined due to insurance Hep C: never completed- pt declined due to insurance STI Screening: low risk Seasonal Influenza Vaccination: current  Td/Tdap Vaccination: Current- due 11/28/25 Pneumococcal Vaccination: 23 Due. 13 completed 01/18/16.  Zoster Vaccination: due Frequency of Dental evaluation: Due. Last been 1.5 years ago.  Frequency of eye exam: Due. Last "several years ago"   ROS:  Constitutional: Negative for activity change, appetite change, fatigue and unexpected weight change.  HENT: Negative for congestion, dental problem, ear pain, hearing loss, mouth sores, postnasal drip, rhinorrhea, sneezing, sore throat, tinnitus and trouble swallowing.   Eyes: Negative for photophobia, pain, redness and visual disturbance.  Respiratory: Negative for cough, chest tightness and shortness of breath.   Cardiovascular: Negative for chest pain, palpitations and leg swelling.  Gastrointestinal: Negative for abdominal pain, blood in stool, constipation, diarrhea, nausea and vomiting.  Genitourinary: Negative for dysuria, frequency, hematuria and urgency.  Musculoskeletal: Negative for arthralgias, gait problem, myalgias and neck stiffness.  Skin: Negative for rash.  Neurological: Negative for dizziness, speech difficulty, weakness, light-headedness, numbness and headaches.  Hematological: Negative for adenopathy.  Psychiatric/Behavioral: Negative for confusion and sleep disturbance. The patient is not nervous/anxious.    PMH:  Patient Active Problem List   Diagnosis Date Noted  . Anemia 01/23/2017  . Asthma 01/23/2017  . Atrial fibrillation ([place]) 01/29/2013    Allergies:  Allergies  Allergen Reactions  . Naproxen Sodium Other (See Comments)    Sweating  and rapid heart beat    Medications:  Current Outpatient Prescriptions on File Prior to Visit  Medication  Sig Dispense Refill  . aspirin 81 MG EC tablet Take 1 tablet (81 mg total) by mouth daily. Swallow whole. 30 tablet 12  . ibuprofen (ADVIL,MOTRIN) 200 MG tablet Take 200 mg by mouth every 6 (six) hours as needed for pain.     No current facility-administered medications on file prior to visit.     PE:  GS: WDWN male sitting on exam table in NAD.  Vitals: BP 136/84   Pulse 90   Temp 98.7 F (37.1 C) (Oral)   Resp 16   Ht 5\' 10"  (1.778 m)   Wt 270 lb 6.4 oz (122.7 kg)   SpO2 97%   BMI 38.80 kg/m  HENT:  Head: Normocephalicand atraumatic.  Right Ear: Hearing, tympanic membrane, external earand ear canalnormal. No foreign bodies.  Left Ear: Hearing, tympanic membrane, external earand ear canalnormal. No foreign bodies.  Nose: Nose normal.  Mouth/Throat: Uvula is midline, oropharynx is clear and moistand mucous membranes are normal. No oral lesions. Normal dentition. No dental abscessesor uvula swelling. No oropharyngeal exudate.  Eyes: Conjunctivae, EOMand lidsare normal. Pupils are equal, round, and reactive to light. Right eye exhibits no discharge. Left eye exhibits no discharge. No scleral icterus.  Fundoscopic exam: The right eye shows no arteriolar narrowing, no AV nicking, no exudate, no hemorrhageand no papilledema. The right eye shows red reflex.  The left eye shows no arteriolar narrowing, no AV nicking, no exudate, no hemorrhageand no papilledema. The left eye shows red reflex.  Neck: Trachea normal, normal range of motionand full passive range of motion without pain. Neck supple. No spinous process tendernessand no muscular tendernesspresent. No thyroid massand no thyromegalypresent.  Cardiovascular: Normal rate. Irregular rhythm. In a-fib. intact distal pulsesand irregular pulses.  Pulmonary/Chest: Effort normaland breath sounds normal. Right breast exhibits no inverted nipple, no mass, no nipple discharge, no skin changeand no tenderness.  Abdominal: Soft. Bowel sounds are normal. She exhibits no distensionand no mass. There is no tenderness. There is no reboundand no guarding.  Musculoskeletal: She exhibits no edemaor tenderness.  Cervical back: Normal.  Thoracic back: Normal.  Lumbar back: Normal.  Lymphadenopathy:  Head (right side): No tonsillar, no preauricular, no posterior auricularand no occipitaladenopathy present.  Head (left side): No tonsillar, no preauricular, no posterior auricularand no occipitaladenopathy present.  She has no cervical adenopathy.  Right: No supraclavicularadenopathy present.  Left: No supraclavicularadenopathy present.  Neurological: She is alertand oriented to person, place, and time. She has normal strengthand normal reflexes. No cranial nerve deficit. She exhibits normal muscle tone. Coordinationand gaitnormal.  Skin: Skin is warm, dryand intact. No rashnoted. She is not diaphoretic. No cyanosisor erythema. Nails show no clubbing.  Psychiatric: She has a normal mood and affect. Her speech is normaland behavior is normal. Judgmentand thought contentnormal.    Labs:   Recent Results (from the past 2160 hour(s))  POCT urinalysis dipstick     Status: Abnormal   Collection Time: 01/23/17  8:48 AM  Result Value Ref Range   Color, UA yellow yellow   Clarity, UA cloudy (A) clear   Glucose, UA negative negative mg/dL   Bilirubin, UA negative negative   Ketones, POC UA negative negative mg/dL   Spec Grav, UA 1.025 1.010 - 1.025   Blood, UA large (A) negative   pH, UA 5.0 5.0 - 8.0   Protein Ur, POC trace (A) negative mg/dL  Urobilinogen, UA 0.2 0.2 or 1.0 E.U./dL   Nitrite, UA Positive (A) Negative   Leukocytes, UA Trace (A) Negative    A&P:  1. Annual physical exam- normal exam  2. Elevated hemoglobin A1c - Reevaluate today Plan: Hemoglobin A1c  3. Hyperglycemia - reevaluate today Plan: Comprehensive metabolic panel  4.  Abnormal urine odor - x several months. Culture and abx sent. Most likely UTI.  Plan:  -Lab: POCT urinalysis dipstick- see above, Urine culture  -Pharm: ciprofloxacin (CIPRO) 500 MG tablet  5. Need for shingles vaccine - Plan: Zoster Vac Recomb Adjuvanted [facility]) injection  6. Atrial fibrillation, unspecified type ([place]) - Asymptomatic. CHADSVASc: 1 Plan: Ambulatory referral to Cardiology  7. Hematuria, unspecified type- asymptomatic. Will f/u after possible UTI clearance and if hematuria is continue refer to urology  8. Mild intermittent asthma without complication - controlled on current medications. Reevaluate today.  Plan: Fluticasone-Salmeterol (ADVAIR DISKUS) 250-50 MCG/DOSE AEPB  9. Other seasonal allergic rhinitis - mild symptoms today. Does not take Flonase daily.  Plan: fluticasone (FLONASE) 50 MCG/ACT nasal spray  10. Anemia, unspecified type - asymptomatic.  Plan: CBC with Differential/Platelet  Need for prophylactic vaccination against Streptococcus pneumoniae (pneumococcus) - Plan: Pneumococcal polysaccharide vaccine 23-valent greater than or equal to 2yo subcutaneous/IM       Respectfully,  Delilah Shan, PA-S2

## 2017-01-23 NOTE — Assessment & Plan Note (Signed)
Remains asymptomatic. Rate controlled without medications. Continue ASA. Follow-up with cardiology.

## 2017-01-23 NOTE — Assessment & Plan Note (Signed)
Update CBC. 

## 2017-01-24 ENCOUNTER — Encounter: Payer: Self-pay | Admitting: Physician Assistant

## 2017-01-25 LAB — URINE CULTURE

## 2017-01-31 MED ORDER — NITROFURANTOIN MACROCRYSTAL 100 MG PO CAPS: 100.0000 mg | ORAL_CAPSULE | Freq: Two times a day (BID) | ORAL | 0 refills | Status: AC

## 2017-01-31 NOTE — Addendum Note (Signed)
Addended by: Fara Chute on: 01/31/2017 06:20 PM   Modules accepted: Orders

## 2017-03-07 ENCOUNTER — Ambulatory Visit (INDEPENDENT_AMBULATORY_CARE_PROVIDER_SITE_OTHER): Payer: PPO | Admitting: Physician Assistant

## 2017-03-07 ENCOUNTER — Encounter: Payer: Self-pay | Admitting: Physician Assistant

## 2017-03-07 VITALS — BP 140/82 | HR 76 | Resp 18 | Ht 70.5 in | Wt 266.0 lb

## 2017-03-07 DIAGNOSIS — I4891 Unspecified atrial fibrillation: Secondary | ICD-10-CM

## 2017-03-07 DIAGNOSIS — Z6837 Body mass index (BMI) 37.0-37.9, adult: Secondary | ICD-10-CM | POA: Insufficient documentation

## 2017-03-07 DIAGNOSIS — N3001 Acute cystitis with hematuria: Secondary | ICD-10-CM

## 2017-03-07 DIAGNOSIS — N410 Acute prostatitis: Secondary | ICD-10-CM | POA: Diagnosis not present

## 2017-03-07 LAB — POCT URINALYSIS DIP (MANUAL ENTRY)
BILIRUBIN UA: NEGATIVE mg/dL
Bilirubin, UA: NEGATIVE
GLUCOSE UA: NEGATIVE mg/dL
Nitrite, UA: NEGATIVE
Spec Grav, UA: 1.025 (ref 1.010–1.025)
Urobilinogen, UA: 1 E.U./dL
pH, UA: 5.5 (ref 5.0–8.0)

## 2017-03-07 LAB — POC MICROSCOPIC URINALYSIS (UMFC): MUCUS RE: ABSENT

## 2017-03-07 MED ORDER — SULFAMETHOXAZOLE-TRIMETHOPRIM 800-160 MG PO TABS: 1.0000 | ORAL_TABLET | Freq: Two times a day (BID) | ORAL | 0 refills | Status: AC

## 2017-03-07 NOTE — Patient Instructions (Addendum)
If your symptoms do not resolve with the treatment, return in 2-3 weeks. If the symptoms resolve, return in 4 weeks to recheck for blood.  If the blood persists, or if the symptoms do not resolve, we will refer you to a urologist.    IF you received an x-ray today, you will receive an invoice from [facility]. Please contact [facility] at [phone] with questions or concerns regarding your invoice.   IF you received labwork today, you will receive an invoice from [place]. Please contact [facility] at [phone] with questions or concerns regarding your invoice.   Our billing staff will not be able to assist you with questions regarding bills from these companies.  You will be contacted with the lab results as soon as they are available. The fastest way to get your results is to activate your My Chart account. Instructions are located on the last page of this paperwork. If you have not heard from [place] regarding the results in 2 weeks, please contact this office.

## 2017-03-07 NOTE — Progress Notes (Signed)
Subjective:    Patient ID: Daniel Underwood, male    DOB: [birth year], 71 y.o.   MRN: [id number]   HPI: Patient presents today for a re-evaluation of urine after completing nitrofuritoin treatment for a UTI. On 01/23/17 patient was seen for annual exam and was found to have large amounts of blood in his urine and urine culture positive for E.coli with specimen resistant to cirpofloxacin which is when he was switched to nitrofuritoin. Since completing treatment the patient still notes a strange odor with his urine and some bright yellow urine. He denies seeing blood in urine, fever, abdominal/suprapubic pain, urgency, frequency, dysuria and penile discharge. Admits to waking 2-3 times a night to urinate.   Patient is due for Hepatitis C screening.   Past Medical History:  Diagnosis Date  . Allergy   . Asthma   . Atrial fibrillation [facility])    Past Surgical History:  Procedure Laterality Date  . skin cancer, face    . TONSILLECTOMY AND ADENOIDECTOMY     Family History  Problem Relation Age of Onset  . Hypertension Mother   . Heart disease Mother   . Cancer Father        Lung  . Cancer Brother        brain  . Cancer Daughter        Lung/Bone  . CAD Brother 56   Allergies  Allergen Reactions  . Naproxen Sodium Other (See Comments)    Sweating and rapid heart beat   Prior to Admission medications   Medication Sig Start Date End Date Taking? Authorizing Provider  aspirin 81 MG EC tablet Take 1 tablet (81 mg total) by mouth daily. Swallow whole. 01/27/13  Yes Copland, Gay Filler, MD  fluticasone (FLONASE) 50 MCG/ACT nasal spray Place 2 sprays into both nostrils daily. 01/23/17  Yes Jeffery, Chelle, PA-C  Fluticasone-Salmeterol (ADVAIR DISKUS) 250-50 MCG/DOSE AEPB Inhale 1 puff into the lungs 2 (two) times daily. 01/23/17  Yes Jeffery, Chelle, PA-C  ibuprofen (ADVIL,MOTRIN) 200 MG tablet Take 200 mg by mouth every 6 (six) hours as needed for pain.    [provider]     Review  of Systems  Constitutional: Negative for fever.  HENT: Negative.   Eyes: Negative.   Respiratory: Negative.   Cardiovascular: Negative.   Gastrointestinal: Negative for abdominal pain, diarrhea, nausea and vomiting.  Endocrine: Negative for polyuria.  Genitourinary: Negative for decreased urine volume, difficulty urinating, discharge, dysuria, flank pain, frequency, hematuria, penile pain and urgency.  Musculoskeletal: Negative.   Skin: Negative.   Allergic/Immunologic: Negative.   Neurological: Negative.   Hematological: Negative.   Psychiatric/Behavioral: Negative.        Objective:   Physical Exam  Constitutional: He is oriented to person, place, and time. He appears well-developed and well-nourished. No distress.  HENT:  Head: Normocephalic and atraumatic.  Eyes: Conjunctivae and EOM are normal. Pupils are equal, round, and reactive to light.  Neck: Normal range of motion. Neck supple.  Cardiovascular:  Irregularly irregular rate and rhythm consistent with diagnosed Afib  Pulmonary/Chest: Effort normal and breath sounds normal.  Abdominal: Soft. He exhibits no distension and no mass. There is no tenderness. There is no rebound and no guarding.  Lymphadenopathy:    He has no cervical adenopathy.  Neurological: He is alert and oriented to person, place, and time.  Skin: Skin is warm and dry. He is not diaphoretic.   BP 140/82   Pulse 76   Resp 18  Ht 5' 10.5" (1.791 m)   Wt 266 lb (120.7 kg)   SpO2 97%   BMI 37.63 kg/m         Assessment & Plan:  1. Hematuria due to acute cystitis - POCT urinalysis dipstick - POCT Microscopic Urinalysis ([facility])  2. Prostatitis Bactrim x3 weeks   3. Atrial fibrillation, unspecified type ([place]) Continue to take ASA 81 mg.  Patient declined going to see cardiology as he feels asymptomatic.  Follow up in 3-4 weeks depending on symptom resolvment. If symptoms have resolved after treatment patient can be seen in 4 weeks. If  symptoms are not resolved after treatment, referral to urology.

## 2017-03-07 NOTE — Progress Notes (Signed)
Patient ID: Daniel Underwood, male    DOB: 01/13/1946, 71 y.o.   MRN: [id number]  PCP: Harrison Mons, PA-C  Chief Complaint  Patient presents with  . Follow-up    PER PATIENT, NEED TO HAVE URINE RECHECKED    Subjective:   Presents for evaluation of hematuria.  He was seen 01/23/2017 for annual exam. At that time, he reported an unusual odor to his urine. UA revealed large blood, positive nitrites and trace leukocytes. He was diagnosed with cystitis and empirically prescribed Cipro. UCX revealed E coli, resistant to that drug, so he was switched to nitrofurantoin. He was advised to follow-up today for repeat UA to recheck for persistent hematuria.  Still notes an odor and darker color of the urine. Wakes 2-3 times at night to urinate, not worse than usual. No fever, chills. No nausea, vomiting. No urgency or increased frequency of urination. No visible hematuria. No abdominal or back pain.  No difficulty emptying the bladder.    Review of Systems As above.    Patient Active Problem List   Diagnosis Date Noted  . BMI 37.0-37.9, adult 03/07/2017  . Anemia 01/23/2017  . Asthma 01/23/2017  . Hematuria due to acute cystitis 01/23/2017  . Atrial fibrillation ([place]) 01/29/2013     Prior to Admission medications   Medication Sig Start Date End Date Taking? Authorizing Provider  aspirin 81 MG EC tablet Take 1 tablet (81 mg total) by mouth daily. Swallow whole. 01/27/13  Yes Copland, Gay Filler, MD  fluticasone (FLONASE) 50 MCG/ACT nasal spray Place 2 sprays into both nostrils daily. 01/23/17  Yes Allona Gondek, PA-C  Fluticasone-Salmeterol (ADVAIR DISKUS) 250-50 MCG/DOSE AEPB Inhale 1 puff into the lungs 2 (two) times daily. 01/23/17  Yes Ronesha Heenan, PA-C  ibuprofen (ADVIL,MOTRIN) 200 MG tablet Take 200 mg by mouth every 6 (six) hours as needed for pain.    [provider]     Allergies  Allergen Reactions  . Naproxen Sodium Other (See Comments)    Sweating  and rapid heart beat       Objective:  Physical Exam  Constitutional: He is oriented to person, place, and time. He appears well-developed and well-nourished. He is active and cooperative. No distress.  BP 140/82   Pulse 76   Resp 18   Ht 5' 10.5" (1.791 m)   Wt 266 lb (120.7 kg)   SpO2 97%   BMI 37.63 kg/m   HENT:  Head: Normocephalic and atraumatic.  Right Ear: Hearing normal.  Left Ear: Hearing normal.  Eyes: Conjunctivae are normal. No scleral icterus.  Neck: Normal range of motion. Neck supple. No thyromegaly present.  Cardiovascular: Normal rate and normal heart sounds.  An irregularly irregular rhythm present.  Pulses:      Radial pulses are 2+ on the right side, and 2+ on the left side.  Pulmonary/Chest: Effort normal and breath sounds normal.  Lymphadenopathy:       Head (right side): No tonsillar, no preauricular, no posterior auricular and no occipital adenopathy present.       Head (left side): No tonsillar, no preauricular, no posterior auricular and no occipital adenopathy present.    He has no cervical adenopathy.       Right: No supraclavicular adenopathy present.       Left: No supraclavicular adenopathy present.  Neurological: He is alert and oriented to person, place, and time. No sensory deficit.  Skin: Skin is warm, dry and intact. No rash noted. No  cyanosis or erythema. Nails show no clubbing.  Psychiatric: He has a normal mood and affect. His speech is normal and behavior is normal.    Results for orders placed or performed in visit on 03/07/17  POCT urinalysis dipstick  Result Value Ref Range   Color, UA yellow yellow   Clarity, UA cloudy (A) clear   Glucose, UA negative negative mg/dL   Bilirubin, UA negative negative   Ketones, POC UA negative negative mg/dL   Spec Grav, UA 1.025 1.010 - 1.025   Blood, UA moderate (A) negative   pH, UA 5.5 5.0 - 8.0   Protein Ur, POC trace (A) negative mg/dL   Urobilinogen, UA 1.0 0.2 or 1.0 E.U./dL    Nitrite, UA Negative Negative   Leukocytes, UA Small (1+) (A) Negative  POCT Microscopic Urinalysis ([facility])  Result Value Ref Range   WBC,UR,HPF,POC Many (A) None WBC/hpf   RBC,UR,HPF,POC Moderate (A) None RBC/hpf   Bacteria Too numerous to count  None, Too numerous to count   Mucus Absent Absent   Epithelial Cells, UR Per Microscopy Few (A) None, Too numerous to count cells/hpf       Assessment & Plan:   1. Hematuria due to acute cystitis 3. Acute prostatitis Hematuria persists. Odor persists. Nitrites resolved. Suspect prostatitis. Treat with 21 days of SMX-TMP, then re-evaluate. If symptoms persist, will refer to urology. Likely has BPH, given nocturia. - POCT urinalysis dipstick - POCT Microscopic Urinalysis ([facility]) - sulfamethoxazole-trimethoprim (BACTRIM DS,SEPTRA DS) 800-160 MG tablet; Take 1 tablet by mouth 2 (two) times daily.  Dispense: 42 tablet; Refill: 0  2. Atrial fibrillation, unspecified type ([place]) Asymptomatic. Rate controlled. He declined to schedule with cardiology when contacted following our last visit. Still not interested.   Return in about 4 weeks (around 04/04/2017), or if symptoms worsen or fail to improve in 2-3 weeks, for re-evaluation of blood in urine.   Fara Chute, PA-C [facility]

## 2017-04-04 ENCOUNTER — Encounter: Payer: Self-pay | Admitting: Physician Assistant

## 2017-04-04 ENCOUNTER — Ambulatory Visit (INDEPENDENT_AMBULATORY_CARE_PROVIDER_SITE_OTHER): Payer: PPO | Admitting: Physician Assistant

## 2017-04-04 VITALS — BP 147/88 | HR 70 | Temp 97.8°F | Resp 16 | Ht 69.09 in | Wt 267.4 lb

## 2017-04-04 DIAGNOSIS — B001 Herpesviral vesicular dermatitis: Secondary | ICD-10-CM | POA: Diagnosis not present

## 2017-04-04 DIAGNOSIS — I4891 Unspecified atrial fibrillation: Secondary | ICD-10-CM

## 2017-04-04 DIAGNOSIS — R3121 Asymptomatic microscopic hematuria: Secondary | ICD-10-CM | POA: Diagnosis not present

## 2017-04-04 LAB — POCT URINALYSIS DIP (MANUAL ENTRY)
Glucose, UA: NEGATIVE mg/dL
Ketones, POC UA: NEGATIVE mg/dL
Leukocytes, UA: NEGATIVE
Nitrite, UA: NEGATIVE
PH UA: 6 (ref 5.0–8.0)
RBC UA: NEGATIVE
Urobilinogen, UA: 1 E.U./dL

## 2017-04-04 NOTE — Progress Notes (Signed)
Patient ID: Daniel Underwood, male    DOB: [birth year], 71 y.o.   MRN: [id number]  PCP: Harrison Mons, PA-C  Chief Complaint  Patient presents with  . Hematuria    f/u    Subjective:   Presents for evaluation of hematuria and malodorous urine.  This was initially address at his wellness visit in 01/2017, when he reported an odor to the urine. Hematuria was noted, and he was started empirically on ciprofloxacin. The culture revealed E coli, resistant to ciprofloxacin, so he was changed to nitrofurantoin. Upon re-evaluation, he reported some improvement, but persistent odor. She was started on SMX-TMP x 21 days to cover for prostatitis. Odor has resolved.  While on the SMX-TMP, he developed a tender bump in the RIGHT ear. Then he felt a rough place and used his fingernail to scrape off what looked like a scab. About that same time, he developed a fever blister on the LEFT lower lip. He relates getting a fever blister every year or two. Never previously associated with ear pain. No associated nasal congestion, drainage, ear fullness, cough, sore throat.    Review of Systems No chest pain, SOB, HA, dizziness, vision change, N/V, diarrhea, constipation, dysuria, urinary urgency or frequency, fever, chills, myalgias, arthralgias.     Patient Active Problem List   Diagnosis Date Noted  . BMI 37.0-37.9, adult 03/07/2017  . Anemia 01/23/2017  . Asthma 01/23/2017  . Hematuria due to acute cystitis 01/23/2017  . Atrial fibrillation ([place]) 01/29/2013     Prior to Admission medications   Medication Sig Start Date End Date Taking? Authorizing Provider  aspirin 81 MG EC tablet Take 1 tablet (81 mg total) by mouth daily. Swallow whole. 01/27/13  Yes Copland, Gay Filler, MD  fluticasone (FLONASE) 50 MCG/ACT nasal spray Place 2 sprays into both nostrils daily. 01/23/17  Yes Bennet Kujawa, PA-C  Fluticasone-Salmeterol (ADVAIR DISKUS) 250-50 MCG/DOSE AEPB Inhale 1 puff into the lungs 2 (two)  times daily. 01/23/17  Yes Dailey Alberson, PA-C  ibuprofen (ADVIL,MOTRIN) 200 MG tablet Take 200 mg by mouth every 6 (six) hours as needed for pain.   Yes [provider]     Allergies  Allergen Reactions  . Naproxen Sodium Other (See Comments)    Sweating and rapid heart beat       Objective:  Physical Exam  Constitutional: He is oriented to person, place, and time. He appears well-developed and well-nourished. He is active and cooperative. No distress.  BP (!) 147/88 (BP Location: Right Arm, Patient Position: Sitting, Cuff Size: Large)   Pulse 70   Temp 97.8 F (36.6 C) (Oral)   Resp 16   Ht 5' 9.09" (1.755 m)   Wt 267 lb 6.4 oz (121.3 kg)   SpO2 95%   BMI 39.38 kg/m   HENT:  Head: Normocephalic and atraumatic.  Right Ear: Hearing, tympanic membrane, external ear and ear canal normal.  Left Ear: Hearing, tympanic membrane and ear canal normal. There is drainage (dried, crusty material noted at the verge between the pinna and the EAC. No erythema or edema. No lesions.).  Mouth/Throat:    Eyes: Conjunctivae are normal. No scleral icterus.  Neck: Normal range of motion. Neck supple. No thyromegaly present.  Cardiovascular: Normal rate and normal heart sounds.  An irregularly irregular rhythm present.  Pulses:      Radial pulses are 2+ on the right side, and 2+ on the left side.  Pulmonary/Chest: Effort normal and breath sounds normal.  Lymphadenopathy:       Head (right side): No tonsillar, no preauricular, no posterior auricular and no occipital adenopathy present.       Head (left side): No tonsillar, no preauricular, no posterior auricular and no occipital adenopathy present.    He has no cervical adenopathy.       Right: No supraclavicular adenopathy present.       Left: No supraclavicular adenopathy present.  Neurological: He is alert and oriented to person, place, and time. No sensory deficit.  Skin: Skin is warm, dry and intact. No rash noted. No cyanosis  or erythema. Nails show no clubbing.  Psychiatric: He has a normal mood and affect. His speech is normal and behavior is normal.       Results for orders placed or performed in visit on 04/04/17  POCT urinalysis dipstick  Result Value Ref Range   Color, UA yellow yellow   Clarity, UA clear clear   Glucose, UA negative negative mg/dL   Bilirubin, UA small (A) negative   Ketones, POC UA negative negative mg/dL   Spec Grav, UA >=1.030 (A) 1.010 - 1.025   Blood, UA negative negative   pH, UA 6.0 5.0 - 8.0   Protein Ur, POC trace (A) negative mg/dL   Urobilinogen, UA 1.0 0.2 or 1.0 E.U./dL   Nitrite, UA Negative Negative   Leukocytes, UA Negative Negative       Assessment & Plan:   Problem List Items Addressed This Visit    Atrial fibrillation ([place])    Remains rate controlled and asymptomatic.       Other Visit Diagnoses    Asymptomatic microscopic hematuria    -  Primary   Likely due to prostatitis. Completed treatment. Resolved.   Relevant Orders   POCT urinalysis dipstick (Completed)   Urine Microscopic   Fever blister       Infrequent. Continue local care. Doubt RIGHT ear symptoms represent Ramsay-Hunt Syndrome.       Return in about 5 months (around 09/04/2017) for re-evaluaiton of heart rate, asthma, etc.   Fara Chute, PA-C [facility]

## 2017-04-04 NOTE — Progress Notes (Signed)
Subjective:    Patient ID: Daniel Underwood, male    DOB: [birth year], 71 y.o.   MRN: [id number] PCP: Harrison Mons, PA-C Chief Complaint  Patient presents with  . Hematuria    f/u    HPI: 71 y/o M presents today for follow up of hematuria. Patient was originally seen on 4/18 for hematuria, UA positive for large blood, nitrate and leukocytes. Was placed on ciproflaxacin and then switched to nitrofuritoin due to E coli resistance revealed on urine culture. He was reevaluated on 5/31 for hematuria, and UA continued to reveal large blood and WBC without nitrates. He was placed on Bactrim at this time and instructed to folllow up in 4 weeks.   Today- Feels that urine has improved. Urine has cleared in color. Denies any pain with urination, low back pain, pain in rectum, or penile discharge.  He also states that since being on the Bactrim, he developed right ear pain. It was tender to touch and felt as though "there was a knot in there". He denies hearing loss, ear canal itching, fluid drainage from ear. He did note however that he was able to scrape out some material with his finger that "wasn't quite white but wasn't ear wax". He states that since finishing the antibiotic the ear pain has started to resolve although he can feel a scab still in his ear. He denies putting anything into his ear canal other than cleaning it with water in the shower. He denies any recent swimming.   Patient Active Problem List   Diagnosis Date Noted  . BMI 37.0-37.9, adult 03/07/2017  . Anemia 01/23/2017  . Asthma 01/23/2017  . Hematuria due to acute cystitis 01/23/2017  . Atrial fibrillation ([place]) 01/29/2013   Past Medical History:  Diagnosis Date  . Allergy   . Asthma   . Atrial fibrillation ([place])    Prior to Admission medications   Medication Sig Start Date End Date Taking? Authorizing Provider  aspirin 81 MG EC tablet Take 1 tablet (81 mg total) by mouth daily. Swallow whole. 01/27/13   Copland, Gay Filler,  MD  fluticasone (FLONASE) 50 MCG/ACT nasal spray Place 2 sprays into both nostrils daily. 01/23/17   Harrison Mons, PA-C  Fluticasone-Salmeterol (ADVAIR DISKUS) 250-50 MCG/DOSE AEPB Inhale 1 puff into the lungs 2 (two) times daily. 01/23/17   Harrison Mons, PA-C  ibuprofen (ADVIL,MOTRIN) 200 MG tablet Take 200 mg by mouth every 6 (six) hours as needed for pain.    [provider]   Allergies  Allergen Reactions  . Naproxen Sodium Other (See Comments)    Sweating and rapid heart beat    Review of Systems  Constitutional: Negative for chills and fever.  HENT: Positive for ear pain. Negative for ear discharge, hearing loss and tinnitus.   Eyes: Negative.   Respiratory: Negative for shortness of breath.   Cardiovascular: Negative for chest pain.  Gastrointestinal: Negative for abdominal pain, blood in stool, constipation, diarrhea, nausea, rectal pain and vomiting.  Endocrine: Negative.   Genitourinary: Negative.  Negative for difficulty urinating, discharge, dysuria, flank pain, frequency, hematuria and urgency.  Musculoskeletal: Negative for back pain.  Skin: Negative.   Allergic/Immunologic: Negative.   Neurological: Negative for dizziness, light-headedness and headaches.  Hematological: Negative.   Psychiatric/Behavioral: Negative.        Objective:   Physical Exam  Constitutional: He is oriented to person, place, and time. He appears well-developed and well-nourished. No distress.  BP (!) 147/88 (BP Location: Right Arm, Patient  Position: Sitting, Cuff Size: Large)   Pulse 70   Temp 97.8 F (36.6 C) (Oral)   Resp 16   Ht 5' 9.09" (1.755 m)   Wt 267 lb 6.4 oz (121.3 kg)   SpO2 95%   BMI 39.38 kg/m    HENT:  Head: Normocephalic and atraumatic.  Right Ear: There is drainage. No swelling or tenderness. No foreign bodies.  Left Ear: Tympanic membrane, external ear and ear canal normal.  Right ear- multiple healing scabs within center of pinna; - canal: crusted  material and cerumen noted;  TM that was able to be observed was normal in appearance without injection, erythema, bulge or perforation.   Eyes: Conjunctivae and EOM are normal. Pupils are equal, round, and reactive to light.  Neck: Normal range of motion. Neck supple. No thyromegaly present.  Cardiovascular: Intact distal pulses.  An irregularly irregular rhythm present.  Pulses:      Radial pulses are 2+ on the right side, and 2+ on the left side.  Pulmonary/Chest: Effort normal and breath sounds normal. He has no wheezes.  Abdominal: Soft. He exhibits no distension and no mass. There is no tenderness. There is no rebound and no guarding.  Lymphadenopathy:       Head (right side): No preauricular and no posterior auricular adenopathy present.       Head (left side): No preauricular and no posterior auricular adenopathy present.    He has no cervical adenopathy.  Neurological: He is alert and oriented to person, place, and time.  Skin: Skin is warm and dry. He is not diaphoretic.  Psychiatric: He has a normal mood and affect. His behavior is normal. Judgment and thought content normal.   Results for orders placed or performed in visit on 04/04/17  POCT urinalysis dipstick  Result Value Ref Range   Color, UA yellow yellow   Clarity, UA clear clear   Glucose, UA negative negative mg/dL   Bilirubin, UA small (A) negative   Ketones, POC UA negative negative mg/dL   Spec Grav, UA >=1.030 (A) 1.010 - 1.025   Blood, UA negative negative   pH, UA 6.0 5.0 - 8.0   Protein Ur, POC trace (A) negative mg/dL   Urobilinogen, UA 1.0 0.2 or 1.0 E.U./dL   Nitrite, UA Negative Negative   Leukocytes, UA Negative Negative       Assessment & Plan:  1. Asymptomatic microscopic hematuria Resolved with use of Bactrim x4 weeks. Likely due to prostatitis. - POCT urinalysis dipstick - Urine Microscopic  2. Atrial fibrillation, unspecified type ([facility]) Chronic condition. Patient not interested in  treatment. Takes baby Aspirin daily.   3. Fever blister Patient only gets about 1 a year. Manages on his own with OTC creams.  Return in about 5 months (around 09/04/2017) for re-evaluaiton of heart rate, asthma, etc.

## 2017-04-04 NOTE — Assessment & Plan Note (Signed)
Remains rate controlled and asymptomatic.

## 2017-04-04 NOTE — Patient Instructions (Signed)
     IF you received an x-ray today, you will receive an invoice from [facility]. Please contact [facility] at [phone] with questions or concerns regarding your invoice.   IF you received labwork today, you will receive an invoice from [facility]. Please contact [facility] at [phone] with questions or concerns regarding your invoice.   Our billing staff will not be able to assist you with questions regarding bills from these companies.  You will be contacted with the lab results as soon as they are available. The fastest way to get your results is to activate your My Chart account. Instructions are located on the last page of this paperwork. If you have not heard from us regarding the results in 2 weeks, please contact this office.     

## 2017-04-05 LAB — URINALYSIS, MICROSCOPIC ONLY
Bacteria, UA: NONE SEEN
CASTS: NONE SEEN /LPF
EPITHELIAL CELLS (NON RENAL): NONE SEEN /HPF (ref 0–10)

## 2017-12-31 DIAGNOSIS — J189 Pneumonia, unspecified organism: Secondary | ICD-10-CM | POA: Diagnosis not present

## 2017-12-31 DIAGNOSIS — J45901 Unspecified asthma with (acute) exacerbation: Secondary | ICD-10-CM | POA: Diagnosis not present

## 2017-12-31 DIAGNOSIS — R05 Cough: Secondary | ICD-10-CM | POA: Diagnosis not present

## 2018-01-13 ENCOUNTER — Encounter: Payer: Self-pay | Admitting: Physician Assistant

## 2018-02-23 ENCOUNTER — Other Ambulatory Visit: Payer: Self-pay | Admitting: Physician Assistant

## 2018-02-23 DIAGNOSIS — J4520 Mild intermittent asthma, uncomplicated: Secondary | ICD-10-CM

## 2018-02-24 NOTE — Telephone Encounter (Signed)
Chelle, the patient's last office visit was 01/23/2017 and [facility] is the new pharmacy for the patient. Please advise if patient needs an office visit before your cut-off date for making appointments. Thank you.

## 2018-02-25 DIAGNOSIS — D485 Neoplasm of uncertain behavior of skin: Secondary | ICD-10-CM | POA: Diagnosis not present

## 2018-02-25 DIAGNOSIS — L570 Actinic keratosis: Secondary | ICD-10-CM | POA: Diagnosis not present

## 2018-02-25 DIAGNOSIS — L578 Other skin changes due to chronic exposure to nonionizing radiation: Secondary | ICD-10-CM | POA: Diagnosis not present

## 2018-02-25 DIAGNOSIS — Z85828 Personal history of other malignant neoplasm of skin: Secondary | ICD-10-CM | POA: Diagnosis not present

## 2018-02-25 DIAGNOSIS — Z859 Personal history of malignant neoplasm, unspecified: Secondary | ICD-10-CM | POA: Diagnosis not present

## 2018-02-25 NOTE — Telephone Encounter (Signed)
Please advise this patient that I have sent his Rx. He needs to establish with a colleague here, or make plans to see me at [facility], for additional fills. If he intends to follow me, I will authorize an additional fill, so that he does not run out before he can see me.  Meds ordered this encounter  Medications  . ADVAIR DISKUS 250-50 MCG/DOSE AEPB    Sig: INHALE 1 PUFF TWICE DAILY.    Dispense:  60 each    Refill:  0

## 2018-03-05 ENCOUNTER — Encounter: Payer: Self-pay | Admitting: Family Medicine

## 2018-06-02 DIAGNOSIS — Z125 Encounter for screening for malignant neoplasm of prostate: Secondary | ICD-10-CM | POA: Diagnosis not present

## 2018-06-02 DIAGNOSIS — I4891 Unspecified atrial fibrillation: Secondary | ICD-10-CM | POA: Diagnosis not present

## 2018-06-02 DIAGNOSIS — Z136 Encounter for screening for cardiovascular disorders: Secondary | ICD-10-CM | POA: Diagnosis not present

## 2018-06-04 DIAGNOSIS — Z125 Encounter for screening for malignant neoplasm of prostate: Secondary | ICD-10-CM | POA: Diagnosis not present

## 2018-06-04 DIAGNOSIS — Z136 Encounter for screening for cardiovascular disorders: Secondary | ICD-10-CM | POA: Diagnosis not present

## 2018-06-04 DIAGNOSIS — Z0000 Encounter for general adult medical examination without abnormal findings: Secondary | ICD-10-CM | POA: Diagnosis not present

## 2018-06-04 DIAGNOSIS — I4891 Unspecified atrial fibrillation: Secondary | ICD-10-CM | POA: Diagnosis not present

## 2018-08-26 DIAGNOSIS — Z85828 Personal history of other malignant neoplasm of skin: Secondary | ICD-10-CM | POA: Diagnosis not present

## 2018-08-26 DIAGNOSIS — C44319 Basal cell carcinoma of skin of other parts of face: Secondary | ICD-10-CM | POA: Diagnosis not present

## 2018-08-26 DIAGNOSIS — D492 Neoplasm of unspecified behavior of bone, soft tissue, and skin: Secondary | ICD-10-CM | POA: Diagnosis not present

## 2018-08-26 DIAGNOSIS — Z859 Personal history of malignant neoplasm, unspecified: Secondary | ICD-10-CM | POA: Diagnosis not present

## 2018-08-26 DIAGNOSIS — Z1283 Encounter for screening for malignant neoplasm of skin: Secondary | ICD-10-CM | POA: Diagnosis not present

## 2018-08-26 DIAGNOSIS — L578 Other skin changes due to chronic exposure to nonionizing radiation: Secondary | ICD-10-CM | POA: Diagnosis not present

## 2018-08-26 DIAGNOSIS — D485 Neoplasm of uncertain behavior of skin: Secondary | ICD-10-CM | POA: Diagnosis not present

## 2018-08-26 DIAGNOSIS — Z872 Personal history of diseases of the skin and subcutaneous tissue: Secondary | ICD-10-CM | POA: Diagnosis not present

## 2018-08-26 DIAGNOSIS — L570 Actinic keratosis: Secondary | ICD-10-CM | POA: Diagnosis not present

## 2018-10-22 DIAGNOSIS — C44319 Basal cell carcinoma of skin of other parts of face: Secondary | ICD-10-CM | POA: Diagnosis not present

## 2018-10-22 DIAGNOSIS — Z85828 Personal history of other malignant neoplasm of skin: Secondary | ICD-10-CM | POA: Diagnosis not present

## 2018-10-22 DIAGNOSIS — L905 Scar conditions and fibrosis of skin: Secondary | ICD-10-CM | POA: Diagnosis not present

## 2018-10-22 DIAGNOSIS — L570 Actinic keratosis: Secondary | ICD-10-CM | POA: Diagnosis not present

## 2019-06-08 DIAGNOSIS — J45909 Unspecified asthma, uncomplicated: Secondary | ICD-10-CM | POA: Diagnosis not present

## 2019-06-08 DIAGNOSIS — Z1211 Encounter for screening for malignant neoplasm of colon: Secondary | ICD-10-CM | POA: Diagnosis not present

## 2019-06-08 DIAGNOSIS — I4891 Unspecified atrial fibrillation: Secondary | ICD-10-CM | POA: Diagnosis not present

## 2019-06-08 DIAGNOSIS — Z0000 Encounter for general adult medical examination without abnormal findings: Secondary | ICD-10-CM | POA: Diagnosis not present

## 2019-07-29 DIAGNOSIS — Z1211 Encounter for screening for malignant neoplasm of colon: Secondary | ICD-10-CM | POA: Diagnosis not present

## 2020-03-18 DIAGNOSIS — J189 Pneumonia, unspecified organism: Secondary | ICD-10-CM | POA: Diagnosis not present

## 2020-03-18 DIAGNOSIS — R509 Fever, unspecified: Secondary | ICD-10-CM | POA: Diagnosis not present

## 2020-03-18 DIAGNOSIS — R911 Solitary pulmonary nodule: Secondary | ICD-10-CM | POA: Diagnosis not present

## 2020-03-18 DIAGNOSIS — R05 Cough: Secondary | ICD-10-CM | POA: Diagnosis not present

## 2020-03-23 DIAGNOSIS — R918 Other nonspecific abnormal finding of lung field: Secondary | ICD-10-CM | POA: Diagnosis not present

## 2020-04-19 DIAGNOSIS — R918 Other nonspecific abnormal finding of lung field: Secondary | ICD-10-CM | POA: Diagnosis not present

## 2020-06-14 DIAGNOSIS — Z0000 Encounter for general adult medical examination without abnormal findings: Secondary | ICD-10-CM | POA: Diagnosis not present

## 2020-06-14 DIAGNOSIS — I4891 Unspecified atrial fibrillation: Secondary | ICD-10-CM | POA: Diagnosis not present

## 2020-06-14 DIAGNOSIS — Z2820 Immunization not carried out because of patient decision for unspecified reason: Secondary | ICD-10-CM | POA: Diagnosis not present

## 2020-06-14 DIAGNOSIS — R911 Solitary pulmonary nodule: Secondary | ICD-10-CM | POA: Diagnosis not present

## 2020-06-14 DIAGNOSIS — I10 Essential (primary) hypertension: Secondary | ICD-10-CM | POA: Diagnosis not present

## 2020-10-12 DIAGNOSIS — I4891 Unspecified atrial fibrillation: Secondary | ICD-10-CM | POA: Diagnosis not present

## 2020-10-12 DIAGNOSIS — J45909 Unspecified asthma, uncomplicated: Secondary | ICD-10-CM | POA: Diagnosis not present

## 2020-10-12 DIAGNOSIS — I10 Essential (primary) hypertension: Secondary | ICD-10-CM | POA: Diagnosis not present

## 2020-10-12 DIAGNOSIS — Z2820 Immunization not carried out because of patient decision for unspecified reason: Secondary | ICD-10-CM | POA: Diagnosis not present

## 2020-11-15 DIAGNOSIS — I4891 Unspecified atrial fibrillation: Secondary | ICD-10-CM | POA: Diagnosis not present

## 2020-11-15 DIAGNOSIS — Z2820 Immunization not carried out because of patient decision for unspecified reason: Secondary | ICD-10-CM | POA: Diagnosis not present

## 2020-11-15 DIAGNOSIS — J45909 Unspecified asthma, uncomplicated: Secondary | ICD-10-CM | POA: Diagnosis not present

## 2020-11-15 DIAGNOSIS — I10 Essential (primary) hypertension: Secondary | ICD-10-CM | POA: Diagnosis not present

## 2020-12-05 DIAGNOSIS — Z872 Personal history of diseases of the skin and subcutaneous tissue: Secondary | ICD-10-CM | POA: Diagnosis not present

## 2020-12-05 DIAGNOSIS — L570 Actinic keratosis: Secondary | ICD-10-CM | POA: Diagnosis not present

## 2020-12-05 DIAGNOSIS — Z859 Personal history of malignant neoplasm, unspecified: Secondary | ICD-10-CM | POA: Diagnosis not present

## 2020-12-05 DIAGNOSIS — Z85828 Personal history of other malignant neoplasm of skin: Secondary | ICD-10-CM | POA: Diagnosis not present

## 2020-12-05 DIAGNOSIS — L578 Other skin changes due to chronic exposure to nonionizing radiation: Secondary | ICD-10-CM | POA: Diagnosis not present

## 2021-03-15 DIAGNOSIS — R059 Cough, unspecified: Secondary | ICD-10-CM | POA: Diagnosis not present

## 2021-03-15 DIAGNOSIS — J45909 Unspecified asthma, uncomplicated: Secondary | ICD-10-CM | POA: Diagnosis not present

## 2021-03-15 DIAGNOSIS — I4891 Unspecified atrial fibrillation: Secondary | ICD-10-CM | POA: Diagnosis not present

## 2021-03-15 DIAGNOSIS — I10 Essential (primary) hypertension: Secondary | ICD-10-CM | POA: Diagnosis not present

## 2021-04-26 DIAGNOSIS — I10 Essential (primary) hypertension: Secondary | ICD-10-CM | POA: Diagnosis not present

## 2021-04-26 DIAGNOSIS — Z2820 Immunization not carried out because of patient decision for unspecified reason: Secondary | ICD-10-CM | POA: Diagnosis not present

## 2021-07-22 DIAGNOSIS — I4891 Unspecified atrial fibrillation: Secondary | ICD-10-CM | POA: Diagnosis not present

## 2021-07-22 DIAGNOSIS — S20212A Contusion of left front wall of thorax, initial encounter: Secondary | ICD-10-CM | POA: Diagnosis not present

## 2021-07-22 DIAGNOSIS — I519 Heart disease, unspecified: Secondary | ICD-10-CM | POA: Diagnosis not present

## 2021-08-08 DIAGNOSIS — J45909 Unspecified asthma, uncomplicated: Secondary | ICD-10-CM | POA: Diagnosis not present

## 2021-08-08 DIAGNOSIS — I4891 Unspecified atrial fibrillation: Secondary | ICD-10-CM | POA: Diagnosis not present

## 2021-08-08 DIAGNOSIS — Z2820 Immunization not carried out because of patient decision for unspecified reason: Secondary | ICD-10-CM | POA: Diagnosis not present

## 2021-08-08 DIAGNOSIS — I10 Essential (primary) hypertension: Secondary | ICD-10-CM | POA: Diagnosis not present

## 2021-08-08 DIAGNOSIS — Z0000 Encounter for general adult medical examination without abnormal findings: Secondary | ICD-10-CM | POA: Diagnosis not present

## 2022-02-13 ENCOUNTER — Emergency Department
Admission: EM | Admit: 2022-02-13 | Discharge: 2022-02-13 | Disposition: A | Discharge status: Home / Self Care | Payer: PPO | Attending: Emergency Medicine | Admitting: Emergency Medicine

## 2022-02-13 ENCOUNTER — Emergency Department: Payer: PPO

## 2022-02-13 ENCOUNTER — Other Ambulatory Visit: Payer: Self-pay

## 2022-02-13 ENCOUNTER — Encounter: Payer: Self-pay | Admitting: Emergency Medicine

## 2022-02-13 DIAGNOSIS — R7989 Other specified abnormal findings of blood chemistry: Secondary | ICD-10-CM | POA: Diagnosis not present

## 2022-02-13 DIAGNOSIS — R600 Localized edema: Secondary | ICD-10-CM | POA: Diagnosis not present

## 2022-02-13 DIAGNOSIS — J45909 Unspecified asthma, uncomplicated: Secondary | ICD-10-CM | POA: Insufficient documentation

## 2022-02-13 DIAGNOSIS — M7989 Other specified soft tissue disorders: Secondary | ICD-10-CM | POA: Diagnosis present

## 2022-02-13 DIAGNOSIS — R0600 Dyspnea, unspecified: Secondary | ICD-10-CM | POA: Diagnosis not present

## 2022-02-13 DIAGNOSIS — R21 Rash and other nonspecific skin eruption: Secondary | ICD-10-CM | POA: Insufficient documentation

## 2022-02-13 DIAGNOSIS — I10 Essential (primary) hypertension: Secondary | ICD-10-CM | POA: Diagnosis not present

## 2022-02-13 DIAGNOSIS — Z7982 Long term (current) use of aspirin: Secondary | ICD-10-CM | POA: Diagnosis not present

## 2022-02-13 LAB — BASIC METABOLIC PANEL
Anion gap: 9 (ref 5–15)
BUN: 18 mg/dL (ref 8–23)
CO2: 25 mmol/L (ref 22–32)
Calcium: 9.2 mg/dL (ref 8.9–10.3)
Chloride: 101 mmol/L (ref 98–111)
Creatinine, Ser: 0.77 mg/dL (ref 0.61–1.24)
GFR, Estimated: >60 mL/min (ref >60)
Glucose, Bld: 130 mg/dL — ABNORMAL HIGH (ref 70–99)
Potassium: 3.8 mmol/L (ref 3.5–5.1)
Sodium: 135 mmol/L (ref 135–145)

## 2022-02-13 LAB — CBC
HCT: 41.6 % (ref 39.0–52.0)
Hemoglobin: 13.9 g/dL (ref 13.0–17.0)
MCH: 31.6 pg (ref 26.0–34.0)
MCHC: 33.4 g/dL (ref 30.0–36.0)
MCV: 94.5 fL (ref 80.0–100.0)
Platelets: 169 10*3/uL (ref 150–400)
RBC: 4.4 MIL/uL (ref 4.22–5.81)
RDW: 12.8 % (ref 11.5–15.5)
WBC: 7.8 10*3/uL (ref 4.0–10.5)
nRBC: 0 % (ref 0.0–0.2)

## 2022-02-13 LAB — TROPONIN I (HIGH SENSITIVITY): Troponin I (High Sensitivity): 6 ng/L (ref <18)

## 2022-02-13 LAB — BRAIN NATRIURETIC PEPTIDE: B Natriuretic Peptide: 187.2 pg/mL — ABNORMAL HIGH (ref 0.0–100.0)

## 2022-02-13 LAB — MAGNESIUM: Magnesium: 2.3 mg/dL (ref 1.7–2.4)

## 2022-02-13 MED ORDER — FUROSEMIDE 40 MG PO TABS
40.0000 mg | ORAL_TABLET | Freq: Once | ORAL | Status: AC
Start: 2022-02-13 — End: 2022-02-13
  Administered 2022-02-13: 40 mg via ORAL
  Filled 2022-02-13: qty 1

## 2022-02-13 MED ORDER — TRIAMCINOLONE ACETONIDE 0.1 % EX CREA: 1.0000 "application " | TOPICAL_CREAM | Freq: Two times a day (BID) | CUTANEOUS | 0 refills | Status: DC

## 2022-02-13 MED ORDER — FUROSEMIDE 40 MG PO TABS: 40.0000 mg | ORAL_TABLET | Freq: Every day | ORAL | 0 refills | Status: DC

## 2022-02-13 NOTE — ED Triage Notes (Signed)
Pt has had some swelling for awhile, ankles bilat have been swollen and then started having redness and what appears to have red splotches like psoriasis without diagnosis, concerned about the amount of swelling, pt went to urgent care where he had an xray and was told that his heart was enlarged and fluid build up in his lungs. No hx of chf, does have a hx of a.fib ?

## 2022-02-13 NOTE — ED Provider Notes (Signed)
? ?[facility] ?Provider Note ? ? ? Event Date/Time  ? First MD Initiated Contact with Patient 02/13/22 1710   ?  (approximate) ? ? ?History  ? ?Leg Swelling ? ? ?HPI ? ?Daniel Underwood is a 76 y.o. male with past medical history of atrial fibrillation, hypertension, asthma who presents with concern for leg swelling.  Patient notes that he has had multiple months of lower extremity swelling.  This morning he had a rattling cough and so he went to urgent care.  He was told that he had fluid on his lungs and that he needed to go to the emergency department.  Patient does endorse some dyspnea with exertion this is been going on for several months denies chest pain.  Denies orthopnea or PND.  Also notes a rash on the bilateral lower extremities that is somewhat itching.  This has been going on for several weeks.  Denies fevers chills.  Cough is nonproductive.  Thinks he has gained about 20 pounds in the last 5 months.  Has never been diagnosed with heart failure.  Last echo was in 2014 showed normal EF but LVH.  He is not anticoagulated.  Tells me that he was started on a blood thinner when he was first diagnosed with A-fib but then had gingival bleeding and stopped the medication and started taking 81 of aspirin has not been back to cardiology since. ?  ? ?Past Medical History:  ?Diagnosis Date  ? Allergy   ? Asthma   ? Atrial fibrillation ([place])   ? ? ?Patient Active Problem List  ? Diagnosis Date Noted  ? BMI 37.0-37.9, adult 03/07/2017  ? Anemia 01/23/2017  ? Asthma 01/23/2017  ? Atrial fibrillation ([place]) 01/29/2013  ? ? ? ?Physical Exam  ?Triage Vital Signs: ?ED Triage Vitals  ?Enc Vitals Group  ?   BP 02/13/22 1303 (!) 142/89  ?   Pulse Rate 02/13/22 1303 76  ?   Resp 02/13/22 1303 20  ?   Temp 02/13/22 1303 98.2 ?F (36.8 ?C)  ?   Temp Source 02/13/22 1303 Oral  ?   SpO2 02/13/22 1303 98 %  ?   Weight 02/13/22 1305 294 lb (133.4 kg)  ?   Height 02/13/22 1305 '5\' 10"'$  (1.778 m)  ?   Head  Circumference --   ?   Peak Flow --   ?   Pain Score 02/13/22 1305 0  ?   Pain Loc --   ?   Pain Edu? --   ?   Excl. in [place]? --   ? ? ?Most recent vital signs: ?Vitals:  ? 02/13/22 1303 02/13/22 1715  ?BP: (!) 142/89 140/80  ?Pulse: 76 72  ?Resp: 20 20  ?Temp: 98.2 ?F (36.8 ?C)   ?SpO2: 98% 98%  ? ? ? ?General: Awake, no distress.  ?CV:  Good peripheral perfusion.  2+ lower extremity edema bilaterally, symmetric ?Resp:  Normal effort.  Lungs are clear, no increased work of breathing ?Abd:  No distention.  ?Neuro:             Awake, Alert, Oriented x 3  ?Other:  Large erythematous scaly plaques scattered on the bilateral lower extremities ?2+ DP pulses ? ? ?ED Results / Procedures / Treatments  ?Labs ?(all labs ordered are listed, but only abnormal results are displayed) ?Labs Reviewed  ?BASIC METABOLIC PANEL - Abnormal; Notable for the following components:  ?    Result Value  ? Glucose, Bld 130 (*)   ?  All other components within normal limits  ?BRAIN NATRIURETIC PEPTIDE - Abnormal; Notable for the following components:  ? B Natriuretic Peptide 187.2 (*)   ? All other components within normal limits  ?MAGNESIUM  ?CBC  ?TROPONIN I (HIGH SENSITIVITY)  ? ? ? ?EKG ? ?EKG interpreted by myself shows atrial fibrillation that is rate controlled, no acute ischemic changes, PVCs ? ? ?RADIOLOGY ?Xray interpreted by myself shows cardiomegaly without pulmonary edema ? ? ?PROCEDURES: ? ?Critical Care performed: No ? ?Procedures ? ?MEDICATIONS ORDERED IN ED: ?Medications  ?furosemide (LASIX) tablet 40 mg (has no administration in time range)  ? ? ? ?IMPRESSION / MDM / ASSESSMENT AND PLAN / ED COURSE  ?I reviewed the triage vital signs and the nursing notes. ?             ?               ? ?Differential diagnosis includes, but is not limited to, CHF, pulmonary hypertension, lymphedema, less likely PE, ACS ? ?Patient is a 76 year old male past medical history of longstanding hypertension A-fib not on anticoagulation who presents  with several months of bilateral lower extremity edema and dyspnea on exertion.  He finally was encouraged to go to urgent care today after he had a rattling cough noticed by his family member.  Patient denies dyspnea at rest or any exertional chest pain does feel short of breath when he is doing a lot.  Thinks he is gained about 20 pounds in the last 5 months.  Does not have a history of CHF but does have longstanding hypertension last EF was from 8 years ago with a normal EF but LVH.  He is not anticoagulated apparently was recommended to be on anticoagulation but self DC'd this several years ago.  On exam he is not in respiratory distress.  He does have significant pitting edema the bilateral lower extremities and this is symmetric.  Also has a large erythematous scaly plaques, question psoriasis.  His lungs are clear.  I reviewed his x-ray which shows cardiomegaly but no pulmonary edema radiology does comment on some atelectasis.  BNP mildly elevated his renal function is normal as is his CBC.  EKG shows A-fib that is rate controlled.  Ultimately I suspect that he has developed new CHF over the last several months which is causing his dyspnea on exertion and lower extremity edema.  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  Given he is not hypoxic there is no acute change and he has not failed outpatient diuretics I think that he is appropriate to be started on Lasix and to follow-up closely in heart failure clinic.  I did discuss return for worsening shortness of breath or any new symptoms that are concerning.  We will start triamcinolone for the dermatitis. ? ?  ? ? ?FINAL CLINICAL IMPRESSION(S) / ED DIAGNOSES  ? ?Final diagnoses:  ?Leg edema  ? ? ? ?Rx / DC Orders  ? ?ED Discharge Orders   ? ?      Ordered  ?  AMB referral to CHF clinic       ? 02/13/22 1744  ?  furosemide (LASIX) 40 MG tablet  Daily       ? 02/13/22 1745  ? ?  ?  ? ?  ? ? ? ?Note:  This  document was prepared using Dragon voice recognition software and may include unintentional dictation errors. ?  ?Starleen Blue,  Jennette Dubin, MD ?02/13/22 1745 ? ?

## 2022-02-13 NOTE — ED Provider Triage Note (Signed)
?  Emergency Medicine Provider Triage Evaluation Note ? ?Daniel Underwood , a 76 y.o.male,  was evaluated in triage.  Pt complains of shortness of breath.  Reports ankle and leg swelling as well.  Visited urgent care today, in which chest x-ray showed significant cardiomegaly and fluid in the lungs.  No prior history of CHF. ? ? ?Review of Systems  ?Positive: Shortness of breath ?Negative: Denies fever, chest pain, vomiting ? ?Physical Exam  ? ?Vitals:  ? 02/13/22 1303  ?BP: (!) 142/89  ?Pulse: 76  ?Resp: 20  ?Temp: 98.2 ?F (36.8 ?C)  ?SpO2: 98%  ? ?Gen:   Awake, no distress   ?Resp:  Normal effort  ?MSK:   Moves extremities without difficulty  ?Other:  Speaking in full sentences. ? ?Medical Decision Making  ?Given the patient's initial medical screening exam, the following diagnostic evaluation has been ordered. The patient will be placed in the appropriate treatment space, once one is available, to complete the evaluation and treatment. I have discussed the plan of care with the patient and I have advised the patient that an ED physician or mid-level practitioner will reevaluate their condition after the test results have been received, as the results may give them additional insight into the type of treatment they may need.  ? ? ?Diagnostics: Labs, CXR, EKG ? ?Treatments: none immediately ?  ?Teodoro Spray, [place] ?02/13/22 1319 ? ?

## 2022-02-13 NOTE — Discharge Instructions (Signed)
I suspect that you may have developed heart failure and this is causing your lower extremity swelling.  Please start taking 40 mg of Lasix daily.  Is very important that you follow-up in the heart failure clinic within the next several days.  You also need to have repeat blood work to make sure electrolytes are okay after starting the Lasix.  Please return to the emergency department if you have worsening shortness of breath. ?

## 2022-02-14 ENCOUNTER — Other Ambulatory Visit: Payer: Self-pay | Admitting: Family

## 2022-02-14 ENCOUNTER — Encounter: Payer: Self-pay | Admitting: Family

## 2022-02-14 ENCOUNTER — Ambulatory Visit (HOSPITAL_BASED_OUTPATIENT_CLINIC_OR_DEPARTMENT_OTHER): Payer: PPO | Admitting: Family

## 2022-02-14 ENCOUNTER — Ambulatory Visit
Admission: RE | Admit: 2022-02-14 | Discharge: 2022-02-14 | Disposition: A | Discharge status: Home / Self Care | Payer: PPO | Source: Ambulatory Visit | Attending: Family | Admitting: Family

## 2022-02-14 ENCOUNTER — Encounter: Payer: Self-pay | Admitting: Pharmacist

## 2022-02-14 VITALS — BP 129/89 | HR 81 | Resp 18 | Ht 70.0 in | Wt 288.0 lb

## 2022-02-14 DIAGNOSIS — R0989 Other specified symptoms and signs involving the circulatory and respiratory systems: Secondary | ICD-10-CM | POA: Insufficient documentation

## 2022-02-14 DIAGNOSIS — I110 Hypertensive heart disease with heart failure: Secondary | ICD-10-CM | POA: Insufficient documentation

## 2022-02-14 DIAGNOSIS — I10 Essential (primary) hypertension: Secondary | ICD-10-CM | POA: Diagnosis not present

## 2022-02-14 DIAGNOSIS — I4891 Unspecified atrial fibrillation: Secondary | ICD-10-CM | POA: Insufficient documentation

## 2022-02-14 DIAGNOSIS — I5033 Acute on chronic diastolic (congestive) heart failure: Secondary | ICD-10-CM | POA: Insufficient documentation

## 2022-02-14 DIAGNOSIS — I5032 Chronic diastolic (congestive) heart failure: Secondary | ICD-10-CM | POA: Diagnosis not present

## 2022-02-14 DIAGNOSIS — Z7901 Long term (current) use of anticoagulants: Secondary | ICD-10-CM | POA: Insufficient documentation

## 2022-02-14 DIAGNOSIS — K068 Other specified disorders of gingiva and edentulous alveolar ridge: Secondary | ICD-10-CM | POA: Insufficient documentation

## 2022-02-14 DIAGNOSIS — J45909 Unspecified asthma, uncomplicated: Secondary | ICD-10-CM | POA: Insufficient documentation

## 2022-02-14 DIAGNOSIS — I480 Paroxysmal atrial fibrillation: Secondary | ICD-10-CM

## 2022-02-14 MED ORDER — POTASSIUM CHLORIDE CRYS ER 20 MEQ PO TBCR
EXTENDED_RELEASE_TABLET | ORAL | Status: AC
  Administered 2022-02-14: 40 meq via ORAL
  Filled 2022-02-14: qty 2

## 2022-02-14 MED ORDER — FUROSEMIDE 10 MG/ML IJ SOLN: 80.0000 mg | Freq: Once | INTRAMUSCULAR | Status: AC

## 2022-02-14 MED ORDER — SACUBITRIL-VALSARTAN 24-26 MG PO TABS: 1.0000 | ORAL_TABLET | Freq: Two times a day (BID) | ORAL | 3 refills | Status: DC

## 2022-02-14 MED ORDER — POTASSIUM CHLORIDE CRYS ER 20 MEQ PO TBCR: 40.0000 meq | EXTENDED_RELEASE_TABLET | Freq: Once | ORAL | Status: AC

## 2022-02-14 MED ORDER — FUROSEMIDE 10 MG/ML IJ SOLN
INTRAMUSCULAR | Status: AC
  Administered 2022-02-14: 80 mg via INTRAVENOUS
  Filled 2022-02-14: qty 8

## 2022-02-14 NOTE — Progress Notes (Addendum)
Daniel Underwood COUNSELING NOTE ? ?Guideline-Directed Medical Therapy/Evidence Based Medicine ? ?ACE/ARB/ARNI:  N/A ?Beta Blocker: Metoprolol succinate 50 mg daily ?Aldosterone Antagonist:  N/A ?Diuretic: Furosemide 40 mg daily ?SGLT2i:  N/A ? ?Adherence Assessment ? ?Do you ever forget to take your medication? '[]'$ Yes ?'[x]'$ No  ?Do you ever skip doses due to side effects? '[]'$ Yes ?'[x]'$ No  ?Do you have trouble affording your medicines? '[]'$ Yes ?'[x]'$ No  ?Are you ever unable to pick up your medication due to transportation difficulties? '[]'$ Yes ?'[x]'$ No  ?Do you ever stop taking your medications because you don't believe they are helping? '[]'$ Yes ?'[x]'$ No  ?Do you check your weight daily? '[x]'$ Yes ?'[]'$ No  ? ?Adherence strategy: daily routine ? ?Barriers to obtaining medications: none ? ?Vital signs: HR 81, BP 129/89, weight (pounds) 288 ?ECHO: Date 01/29/2013, EF 55-60%, notes LA mand RA mildy dilated ? ? ?  Latest Ref Rng & Units 02/13/2022  ?  1:09 PM 01/23/2017  ?  8:43 AM 09/27/2015  ?  9:39 AM  ?BMP  ?Glucose 70 - 99 mg/dL 130   112   101    ?BUN 8 - 23 mg/dL '18   22   19    '$ ?Creatinine 0.61 - 1.24 mg/dL 0.77   0.75   0.78    ?BUN/Creat Ratio 10 - 24  29     ?Sodium 135 - 145 mmol/L 135   138   139    ?Potassium 3.5 - 5.1 mmol/L 3.8   4.2   4.3    ?Chloride 98 - 111 mmol/L 101   102   103    ?CO2 22 - 32 mmol/L '25   22   25    '$ ?Calcium 8.9 - 10.3 mg/dL 9.2   9.3   9.0    ? ? ?Past Medical History:  ?Diagnosis Date  ? Allergy   ? Asthma   ? Atrial fibrillation ([place])   ? CHF (congestive heart failure) ([place])   ? Hypertension   ? ? ?ASSESSMENT ?76 year old male who presents to the HF clinic as a new patient. Pt was recently started on lasix (5/9) after his ED visit. Pt and wife inquired if pt needs to take both HCTZ and lasix - will d/w NP. Pt does not otherwise have any questions at this time.  ? ?Recent ED Visit (past 6 months): Date - 5/9, [facility] - leg edema ? ?PLAN ?CHF ?Continue lasix 40  mg daily and Toprol 50 mg daily ?Discussed with NP regarding HCTZ. Would be okay to continue but given his BP this morning with only taking lasix and Toprol this morning, would be okay to discontinue since NP is considering starting pt on Entresto. NP to order repeat echo. ?Consider repeat labs in one week given new start on lasix ? ?Pain ?Pt takes ibuprofen infrequently for pain. Educated pt on NSAIDs/fluid retention and to avoid scheduled use. Also informed pt that if needing pain medication more frequently to utilize acetaminophen.  ? ? ?Time spent: 10 minutes ? ?Daniel Underwood O Daniel Underwood, Pharm.D. ?Clinical Pharmacist ?02/14/2022 10:51 AM ? ? ? ?Current Outpatient Medications:  ?  ADVAIR DISKUS 250-50 MCG/DOSE AEPB, INHALE 1 PUFF TWICE DAILY. (Patient taking differently: 2 puffs in the morning and at bedtime.), Disp: 60 each, Rfl: 0 ?  albuterol (VENTOLIN HFA) 108 (90 Base) MCG/ACT inhaler, Inhale 1-2 puffs into the lungs every 6 (six) hours as needed for wheezing or shortness of breath., Disp: , Rfl:  ?  aspirin 81 MG EC tablet, Take 1 tablet (81 mg total) by mouth daily. Swallow whole., Disp: 30 tablet, Rfl: 12 ?  fluticasone (FLONASE) 50 MCG/ACT nasal spray, Place 2 sprays into both nostrils daily. (Patient taking differently: Place 1 spray into both nostrils daily.), Disp: 16 g, Rfl: 12 ?  furosemide (LASIX) 40 MG tablet, Take 1 tablet (40 mg total) by mouth daily., Disp: 30 tablet, Rfl: 0 ?  ibuprofen (ADVIL,MOTRIN) 200 MG tablet, Take 400 mg by mouth every 6 (six) hours as needed for pain., Disp: , Rfl:  ?  metoprolol succinate (TOPROL-XL) 50 MG 24 hr tablet, Take 50 mg by mouth daily. Take with or immediately following a meal., Disp: , Rfl:  ?  triamcinolone cream (KENALOG) 0.1 %, Apply 1 application. topically 2 (two) times daily., Disp: 30 g, Rfl: 0 ? ? ? ?DRUGS TO CAUTION IN HEART FAILURE  ?Drug or Class Mechanism  ?Analgesics ?NSAIDs ?COX-2 inhibitors ?Glucocorticoids  ?Sodium and water retention, increased  systemic vascular resistance, decreased response to diuretics ?  ?Diabetes Medications ?Metformin ?Thiazolidinediones ?Rosiglitazone (Avandia) ?Pioglitazone (Actos) ?DPP4 Inhibitors ?Saxagliptin (Onglyza) ?Sitagliptin (Januvia) ?  ?Lactic acidosis ?Possible calcium channel blockade ? ? ?Unknown  ?Antiarrhythmics ?Class I  ?Flecainide ?Disopyramide ?Class III ?Sotalol ?Other ?Dronedarone  ?Negative inotrope, proarrhythmic ? ? ?Proarrhythmic, beta blockade ? ?Negative inotrope  ?Antihypertensives ?Alpha Blockers ?Doxazosin ?Calcium Channel Blockers ?Diltiazem ?Verapamil ?Nifedipine ?Central Alpha Adrenergics ?Moxonidine ?Peripheral Vasodilators ?Minoxidil  ?Increases renin and aldosterone ? ?Negative inotrope ? ? ? ?Possible sympathetic withdrawal ? ?Unknown  ?Anti-infective ?Itraconazole ?Amphotericin B  ?Negative inotrope ?Unknown  ?Hematologic ?Anagrelide ?Cilostazol   ?Possible inhibition of PD IV ?Inhibition of PD III causing arrhythmias  ?Neurologic/Psychiatric ?Stimulants ?Anti-Seizure Drugs ?Carbamazepine ?Pregabalin ?Antidepressants ?Tricyclics ?Citalopram ?Parkinsons ?Bromocriptine ?Pergolide ?Pramipexole ?Antipsychotics ?Clozapine ?Antimigraine ?Ergotamine ?Methysergide ?Appetite suppressants ?Bipolar ?Lithium  ?Peripheral alpha and beta agonist activity ? ?Negative inotrope and chronotrope ?Calcium channel blockade ? ?Negative inotrope, proarrhythmic ?Dose-dependent QT prolongation ? ?Excessive serotonin activity/valvular damage ?Excessive serotonin activity/valvular damage ?Unknown ? ?IgE mediated hypersensitivy, calcium channel blockade ? ?Excessive serotonin activity/valvular damage ?Excessive serotonin activity/valvular damage ?Valvular damage ? ?Direct myofibrillar degeneration, adrenergic stimulation  ?Antimalarials ?Chloroquine ?Hydroxychloroquine Intracellular inhibition of lysosomal enzymes  ?Urologic Agents ?Alpha Blockers ?Doxazosin ?Prazosin ?Tamsulosin ?Terazosin  ?Increased renin and  aldosterone  ?Adapted from Page Carleene Overlie, et al. ?Drugs That May Cause or Exacerbate Heart Failure: A Scientific Statement from the American Heart  ?Association.? Circulation 2016; 134:e32-e69. DOI: 10.1161/CIR.0000000000000426 ? ? ?MEDICATION ADHERENCES TIPS AND STRATEGIES ?Taking medication as prescribed improves patient outcomes in heart failure (reduces hospitalizations, improves symptoms, increases survival) ?Side effects of medications can be managed by decreasing doses, switching agents, stopping drugs, or adding additional therapy. Please let someone in the [facility] know if you have having bothersome side effects so we can modify your regimen. Do not alter your medication regimen without talking to [place].  ?Medication reminders can help patients remember to take drugs on time. If you are missing or forgetting doses you can try linking behaviors, using pill boxes, or an electronic reminder like an alarm on your phone or an app. Some people can also get automated phone calls as medication reminders.  ? ? ?

## 2022-02-14 NOTE — Patient Instructions (Addendum)
Continue weighing daily and call for an overnight weight gain of 3 pounds or more or a weekly weight gain of more than 5 pounds. ? ? ?If you have voicemail, please make sure your mailbox is cleaned out so that we may leave a message and please make sure to listen to any voicemails.  ? ? ?Begin entresto 24/'26mg'$  as 1 tablet twice daily ? ? ?[facility]- Dr. Stephani Police ?[place] ?[phone] ?02/2422 at 330pm ?

## 2022-02-14 NOTE — Progress Notes (Signed)
? Patient ID: Daniel Underwood, male    DOB: December 18, 1945, 76 y.o.   MRN: [id number] ? ?HPI ? ?Daniel Underwood is a 76 y/o male with a history of asthma, HTN, atrial fibrillation and chronic heart failure.  ? ?Echo report from 01/29/13 reviewed and showed an EF of 55-60% along with mild LAE.  ? ?Was in the ED 02/13/22 due to leg edema, rattling cough, weight gain and shortness of breath. Evaluated and given oral lasix and he was released.  ? ?He presents today for his initial visit with a chief complaint of moderate shortness of breath with minimal exertion. Describes this as chronic in nature but worsening over the last few days. He has associated fatigue, cough, pedal edema and abdominal distention along with this. Says that the edema has worsened over the last 3-4 weeks. He denies any difficulty sleeping, dizziness, palpitations or chest pain.  ? ?Was in the ED yesterday and was given oral lasix but doesn't feel like that has helped any. Has not been weighing himself daily as he doesn't have any scales.  ? ?Has been taking HCTZ and was wondering if he still takes this in addition to the furosemide.  ? ?Admits to using salt. [place]es to the beach often as they have a place in [facility].  ? ?Past Medical History:  ?Diagnosis Date  ? Allergy   ? Asthma   ? Atrial fibrillation ([place])   ? CHF (congestive heart failure) ([place])   ? Hypertension   ? ?Past Surgical History:  ?Procedure Laterality Date  ? skin cancer, face    ? TONSILLECTOMY AND ADENOIDECTOMY    ? ?Family History  ?Problem Relation Age of Onset  ? Hypertension Mother   ? Heart disease Mother   ? Cancer Father   ?     Lung  ? Cancer Brother   ?     brain  ? Cancer Daughter   ?     Lung/Bone  ? CAD Brother 54  ? ?Social History  ? ?Tobacco Use  ? Smoking status: Never  ? Smokeless tobacco: Never  ?Substance Use Topics  ? Alcohol use: No  ?  Alcohol/week: 0.0 standard drinks  ? ?Allergies  ?Allergen Reactions  ? Naproxen Sodium Other (See Comments)  ?  Sweating and rapid heart  beat  ? ?Prior to Admission medications   ?Medication Sig Start Date End Date Taking? Authorizing Provider  ?ADVAIR DISKUS 250-50 MCG/DOSE AEPB INHALE 1 PUFF TWICE DAILY. ?Patient taking differently: 2 puffs in the morning and at bedtime. 02/25/18  Yes Harrison Mons, PA  ?albuterol (VENTOLIN HFA) 108 (90 Base) MCG/ACT inhaler Inhale 1-2 puffs into the lungs every 6 (six) hours as needed for wheezing or shortness of breath.   Yes [provider]  ?aspirin 81 MG EC tablet Take 1 tablet (81 mg total) by mouth daily. Swallow whole. 01/27/13  Yes Copland, Gay Filler, MD  ?fluticasone (FLONASE) 50 MCG/ACT nasal spray Place 2 sprays into both nostrils daily. ?Patient taking differently: Place 1 spray into both nostrils daily. 01/23/17  Yes Harrison Mons, PA  ?furosemide (LASIX) 40 MG tablet Take 1 tablet (40 mg total) by mouth daily. 02/13/22 03/15/22 Yes Rada Hay, MD  ?ibuprofen (ADVIL,MOTRIN) 200 MG tablet Take 400 mg by mouth every 6 (six) hours as needed for pain.   Yes [provider]  ?metoprolol succinate (TOPROL-XL) 50 MG 24 hr tablet Take 50 mg by mouth daily. Take with or immediately following a meal.  Yes [provider]  ?triamcinolone cream (KENALOG) 0.1 % Apply 1 application. topically 2 (two) times daily. 02/13/22  Yes Rada Hay, MD  ? ?Review of Systems  ?Constitutional:  Positive for fatigue. Negative for appetite change.  ?HENT:  Negative for congestion, postnasal drip and sore throat.   ?Eyes: Negative.   ?Respiratory:  Positive for cough and shortness of breath. Negative for chest tightness.   ?Cardiovascular:  Positive for leg swelling. Negative for chest pain and palpitations.  ?Gastrointestinal:  Positive for abdominal distention. Negative for abdominal pain.  ?Endocrine: Negative.   ?Genitourinary: Negative.   ?Musculoskeletal:  Negative for back pain and neck pain.  ?Skin: Negative.   ?Allergic/Immunologic: Negative.   ?Neurological:  Negative for dizziness  and light-headedness.  ?Hematological:  Negative for adenopathy. Does not bruise/bleed easily.  ?Psychiatric/Behavioral:  Negative for dysphoric mood and sleep disturbance. The patient is not nervous/anxious.   ? ?Vitals:  ? 02/14/22 1008  ?BP: 129/89  ?Pulse: 81  ?Resp: 18  ?SpO2: 95%  ?Weight: 288 lb (130.6 kg)  ?Height: '5\' 10"'$  (1.778 m)  ? ?Wt Readings from Last 3 Encounters:  ?02/14/22 288 lb (130.6 kg)  ?02/13/22 294 lb (133.4 kg)  ?04/04/17 267 lb 6.4 oz (121.3 kg)  ? ?Lab Results  ?Component Value Date  ? CREATININE 0.77 02/13/2022  ? CREATININE 0.75 (L) 01/23/2017  ? CREATININE 0.78 09/27/2015  ? ?Physical Exam ?Vitals and nursing note reviewed. Exam conducted with a chaperone present (wife).  ?Constitutional:   ?   Appearance: Normal appearance.  ?HENT:  ?   Head: Normocephalic and atraumatic.  ?Cardiovascular:  ?   Rate and Rhythm: Normal rate and regular rhythm.  ?Pulmonary:  ?   Effort: Pulmonary effort is normal. No respiratory distress.  ?   Breath sounds: No wheezing or rales.  ?Abdominal:  ?   General: There is distension.  ?   Tenderness: There is no abdominal tenderness.  ?   Comments: Abdomen tight  ?Musculoskeletal:     ?   General: No tenderness.  ?   Cervical back: Normal range of motion and neck supple.  ?   Right lower leg: Edema (2+ pitting) present.  ?   Left lower leg: Edema (2+ pitting) present.  ?Neurological:  ?   General: No focal deficit present.  ?   Mental Status: He is alert and oriented to person, place, and time.  ?Psychiatric:     ?   Mood and Affect: Mood normal.     ?   Behavior: Behavior normal.     ?   Thought Content: Thought content normal.  ? ?Assessment & Plan: ? 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 (LAE)- ?- NYHA class III ?- fluid overloaded today with increasing pedal edema, abdominal distention and SOB ?- will send for '80mg'$  IV lasix/ 43mq PO potassium today ?- labs were just done yesterday so will not repeat  today ?- scales provided and he was instructed to weigh every morning and call for an overnight weight gain of > 2 pounds or a weekly weight gain of > 5 pounds ?- last echo was done 2014 so this has been scheduled for 02/26/22 ?- will add entresto 24/'26mg'$  BID; 30 day voucher provided ?- stop taking HCTZ and continue furosemide ?- will check BMP next visit next week ?- discussed not adding salt to his food and to use Mrs DDeliah Boston pepper, garlic powder etc and to read food labels  for sodium content to keep daily sodium to '2000mg'$  ?- BNP 02/13/22 was 187.2 ? ?2: HTN- ?- BP looks good (129/89) ?- saw PCP Jacqulynn Cadet @ [facility]) 08/08/21 ?- BMP 02/13/22 reviewed and showed sodium 135, potassium 3.8, creatinine 0.77 & GFR >60 ? ?3: Atrial fibrillation- ?- is not on anticoagulation except for baby aspirin ?- says that back in 2014, he was on eliquis but then developed severe gum bleeding so it was stopped ?- discussed various cardiology groups in the community and they say that their daughter had a preference for patient to see Dr. Saralyn Pilar ?- appt scheduled with Dr. Saralyn Pilar on 02/28/22 ? ? ?Medication list reviewed.  ? ?Return in 1 week, sooner if needed.  ?

## 2022-02-15 ENCOUNTER — Encounter: Payer: Self-pay | Admitting: Family

## 2022-02-18 NOTE — Progress Notes (Signed)
? Patient ID: Daniel Underwood, male    DOB: [birth year], 76 y.o.   MRN: [id number] ? ?HPI ? ?Daniel Underwood is a 76 y/o male with a history of asthma, HTN, atrial fibrillation and chronic heart failure.  ? ?Echo report from 01/29/13 reviewed and showed an EF of 55-60% along with mild LAE.  ? ?Was in the ED 02/13/22 due to leg edema, rattling cough, weight gain and shortness of breath. Evaluated and given oral lasix and he was released.  ? ?He presents today for a follow-up visit with a chief complaint of minimal shortness of breath with moderate exertion. Says this has been chronic in nature although he feels better since he was last here. He has associated fatigue, cough, pedal edema (improving) and abdominal distention (improving) along with this. He denies any dizziness, difficulty sleeping, palpitations, chest pain or weight gain.  ? ?Was given '80mg'$  IV lasix/ 55mq PO potassium last week and feels like that has helped quite a bit. Just started his entresto this morning. Says that he's doing much better with his salt usage and ate eggs this morning and only used pepper.  ? ?[place]  ? ?Past Medical History:  ?Diagnosis Date  ? Allergy   ? Asthma   ? Atrial fibrillation ([facility]   ? CHF (congestive heart failure) ([facility]   ? Hypertension   ? ?Past Surgical History:  ?Procedure Laterality Date  ? skin cancer, face    ? TONSILLECTOMY AND ADENOIDECTOMY    ? ?Family History  ?Problem Relation Age of Onset  ? Hypertension Mother   ? Heart disease Mother   ? Cancer Father   ?     Lung  ? Cancer Brother   ?     brain  ? Cancer Daughter   ?     Lung/Bone  ? CAD Brother 757 ? ?Social History  ? ?Tobacco Use  ? Smoking status: Never  ? Smokeless tobacco: Never  ?Substance Use Topics  ? Alcohol use: No  ?  Alcohol/week: 0.0 standard drinks  ? ?Allergies  ?Allergen Reactions  ? Naproxen Sodium Other (See Comments)  ?  Sweating and rapid heart beat  ? ?Prior to Admission medications    ?Medication Sig Start Date End Date Taking? Authorizing Provider  ?ADVAIR DISKUS 250-50 MCG/DOSE AEPB INHALE 1 PUFF TWICE DAILY. ?Patient taking differently: 2 puffs in the morning and at bedtime. 02/25/18  Yes JHarrison Mons PA  ?albuterol (VENTOLIN HFA) 108 (90 Base) MCG/ACT inhaler Inhale 1-2 puffs into the lungs every 6 (six) hours as needed for wheezing or shortness of breath.   Yes [provider]  ?aspirin 81 MG EC tablet Take 1 tablet (81 mg total) by mouth daily. Swallow whole. 01/27/13  Yes Copland, JGay Filler MD  ?fluticasone (FLONASE) 50 MCG/ACT nasal spray Place 2 sprays into both nostrils daily. ?Patient taking differently: Place 1 spray into both nostrils daily. 01/23/17  Yes JHarrison Mons PA  ?furosemide (LASIX) 40 MG tablet Take 1 tablet (40 mg total) by mouth daily. 02/13/22 03/15/22 Yes MRada Hay MD  ?ibuprofen (ADVIL,MOTRIN) 200 MG tablet Take 400 mg by mouth every 6 (six) hours as needed for pain.   Yes [provider]  ?metoprolol succinate (TOPROL-XL) 50 MG 24 hr tablet Take 50 mg by mouth daily. Take with or immediately following a meal.   Yes [provider]  ?sacubitril-valsartan (ENTRESTO) 24-26 MG Take 1  tablet by mouth 2 (two) times daily. 02/14/22  Yes Alisa Graff, FNP  ?triamcinolone cream (KENALOG) 0.1 % Apply 1 application. topically 2 (two) times daily. 02/13/22  Yes Rada Hay, MD  ? ? ?Review of Systems  ?Constitutional:  Positive for fatigue. Negative for appetite change.  ?HENT:  Negative for congestion, postnasal drip and sore throat.   ?Eyes: Negative.   ?Respiratory:  Positive for cough and shortness of breath. Negative for chest tightness.   ?Cardiovascular:  Positive for leg swelling (improving). Negative for chest pain and palpitations.  ?Gastrointestinal:  Positive for abdominal distention (improving). Negative for abdominal pain.  ?Endocrine: Negative.   ?Genitourinary: Negative.   ?Musculoskeletal:  Negative for back pain and  neck pain.  ?Skin: Negative.   ?Allergic/Immunologic: Negative.   ?Neurological:  Negative for dizziness and light-headedness.  ?Hematological:  Negative for adenopathy. Does not bruise/bleed easily.  ?Psychiatric/Behavioral:  Negative for dysphoric mood and sleep disturbance. The patient is not nervous/anxious.   ? ?Vitals:  ? 02/19/22 1153  ?BP: 118/86  ?Pulse: 82  ?Resp: 18  ?SpO2: 96%  ?Weight: 285 lb 2 oz (129.3 kg)  ?Height: '5\' 10"'$  (1.778 m)  ? ?Wt Readings from Last 3 Encounters:  ?02/19/22 285 lb 2 oz (129.3 kg)  ?02/14/22 288 lb (130.6 kg)  ?02/13/22 294 lb (133.4 kg)  ? ?Lab Results  ?Component Value Date  ? CREATININE 0.83 02/19/2022  ? CREATININE 0.77 02/13/2022  ? CREATININE 0.75 (L) 01/23/2017  ? ?Physical Exam ?Vitals and nursing note reviewed. Exam conducted with a chaperone present (wife).  ?Constitutional:   ?   Appearance: Normal appearance.  ?HENT:  ?   Head: Normocephalic and atraumatic.  ?Cardiovascular:  ?   Rate and Rhythm: Normal rate and regular rhythm.  ?Pulmonary:  ?   Effort: Pulmonary effort is normal. No respiratory distress.  ?   Breath sounds: No wheezing or rales.  ?Abdominal:  ?   General: There is distension.  ?   Palpations: Abdomen is soft.  ?   Tenderness: There is no abdominal tenderness.  ?Musculoskeletal:     ?   General: No tenderness.  ?   Cervical back: Normal range of motion and neck supple.  ?   Right lower leg: Edema (2+ pitting) present.  ?   Left lower leg: Edema (2+ pitting) present.  ?   Comments: Legs are much softer  ?Neurological:  ?   General: No focal deficit present.  ?   Mental Status: He is alert and oriented to person, place, and time.  ?Psychiatric:     ?   Mood and Affect: Mood normal.     ?   Behavior: Behavior normal.     ?   Thought Content: Thought content normal.  ? ?Assessment & Plan: ? ?1: Chronic heart failure with preserved ejection fraction with structural changes (LAE)- ?- NYHA class II ?- euvolemic today ?- received '80mg'$  IV lasix/ 39mq PO  potassium last week and feels like his symptoms have improved ?- weighing daily; reminded to call for an overnight weight gain of > 2 pounds or a weekly weight gain of > 5 pounds ?- weight down 3 pounds from last visit here 4 days ago ?- last echo was done 2014 so this has been scheduled for 02/26/22 ?- entresto 24/'26mg'$  BID started last visit although he just started taking it this morning when they returned from the beach ?- will check BMP today since given '80mg'$  IV lasix last week ?-  says that he's doing better with his sodium intake; wife recently bought a salt substitute to try ?- encouraged to get compression socks and put them on every morning with removal at bedtime ?- BNP 02/13/22 was 187.2 ? ?2: HTN- ?- BP looks good (118/86) ?- saw PCP Jacqulynn Cadet @ [place]) 08/08/21 ?- BMP 02/13/22 reviewed and showed sodium 135, potassium 3.8, creatinine 0.77 & GFR >60 ? ?3: Atrial fibrillation- ?- is not on anticoagulation except for baby aspirin ?- says that back in 2014, he was on eliquis but then developed severe gum bleeding so it was stopped ?- to see cardiology ([place]) on 02/28/22 ? ? ?Medication list reviewed.  ? ?Return in 2 weeks, sooner if needed.  ? ?

## 2022-02-19 ENCOUNTER — Other Ambulatory Visit
Admission: RE | Admit: 2022-02-19 | Discharge: 2022-02-19 | Disposition: A | Discharge status: Home / Self Care | Payer: PPO | Source: Ambulatory Visit | Attending: Family | Admitting: Family

## 2022-02-19 ENCOUNTER — Encounter: Payer: Self-pay | Admitting: Family

## 2022-02-19 ENCOUNTER — Ambulatory Visit (HOSPITAL_BASED_OUTPATIENT_CLINIC_OR_DEPARTMENT_OTHER): Payer: PPO | Admitting: Family

## 2022-02-19 VITALS — BP 118/86 | HR 82 | Resp 18 | Ht 70.0 in | Wt 285.1 lb

## 2022-02-19 DIAGNOSIS — I4891 Unspecified atrial fibrillation: Secondary | ICD-10-CM | POA: Insufficient documentation

## 2022-02-19 DIAGNOSIS — I110 Hypertensive heart disease with heart failure: Secondary | ICD-10-CM | POA: Insufficient documentation

## 2022-02-19 DIAGNOSIS — I5032 Chronic diastolic (congestive) heart failure: Secondary | ICD-10-CM

## 2022-02-19 DIAGNOSIS — J45909 Unspecified asthma, uncomplicated: Secondary | ICD-10-CM | POA: Insufficient documentation

## 2022-02-19 DIAGNOSIS — I10 Essential (primary) hypertension: Secondary | ICD-10-CM

## 2022-02-19 DIAGNOSIS — I480 Paroxysmal atrial fibrillation: Secondary | ICD-10-CM

## 2022-02-19 DIAGNOSIS — K068 Other specified disorders of gingiva and edentulous alveolar ridge: Secondary | ICD-10-CM | POA: Insufficient documentation

## 2022-02-19 DIAGNOSIS — Z7901 Long term (current) use of anticoagulants: Secondary | ICD-10-CM | POA: Insufficient documentation

## 2022-02-19 LAB — BASIC METABOLIC PANEL
Anion gap: 9 (ref 5–15)
BUN: 20 mg/dL (ref 8–23)
CO2: 29 mmol/L (ref 22–32)
Calcium: 9.2 mg/dL (ref 8.9–10.3)
Chloride: 98 mmol/L (ref 98–111)
Creatinine, Ser: 0.83 mg/dL (ref 0.61–1.24)
GFR, Estimated: >60 mL/min (ref >60)
Glucose, Bld: 105 mg/dL — ABNORMAL HIGH (ref 70–99)
Potassium: 3.9 mmol/L (ref 3.5–5.1)
Sodium: 136 mmol/L (ref 135–145)

## 2022-02-19 NOTE — Patient Instructions (Addendum)
Continue weighing daily and call for an overnight weight gain of 3 pounds or more or a weekly weight gain of more than 5 pounds. ? ? ?If you have voicemail, please make sure your mailbox is cleaned out so that we may leave a message and please make sure to listen to any voicemails.  ? ? ?Get compression socks and put them on every morning with removal at bedtime ?

## 2022-02-26 ENCOUNTER — Ambulatory Visit
Admission: RE | Admit: 2022-02-26 | Discharge: 2022-02-26 | Disposition: A | Discharge status: Home / Self Care | Payer: PPO | Source: Ambulatory Visit | Attending: Family | Admitting: Family

## 2022-02-26 DIAGNOSIS — I110 Hypertensive heart disease with heart failure: Secondary | ICD-10-CM | POA: Diagnosis not present

## 2022-02-26 DIAGNOSIS — J45909 Unspecified asthma, uncomplicated: Secondary | ICD-10-CM | POA: Insufficient documentation

## 2022-02-26 DIAGNOSIS — I5032 Chronic diastolic (congestive) heart failure: Secondary | ICD-10-CM | POA: Diagnosis present

## 2022-02-26 DIAGNOSIS — I4891 Unspecified atrial fibrillation: Secondary | ICD-10-CM | POA: Diagnosis not present

## 2022-02-26 LAB — ECHOCARDIOGRAM COMPLETE
AR max vel: 2.97 cm2
AV Area VTI: 2.64 cm2
AV Area mean vel: 2.61 cm2
AV Mean grad: 3 mmHg
AV Peak grad: 4.8 mmHg
Ao pk vel: 1.09 m/s
Area-P 1/2: 3.51 cm2
MV VTI: 1.98 cm2
S' Lateral: 3.69 cm

## 2022-03-05 NOTE — Progress Notes (Unsigned)
Patient ID: Daniel Underwood, male    DOB: 01-06-1946, 76 y.o.   MRN: [id number]  HPI  Daniel Underwood is a 76 y/o male with a history of asthma, HTN, atrial fibrillation and chronic heart failure.   Echo report from 02/26/22 reviewed and showed an EF of 55-60% along with mild LVH/LAE. Echo report from 01/29/13 reviewed and showed an EF of 55-60% along with mild LAE.  Was in the ED 02/13/22 due to leg edema, rattling cough, weight gain and shortness of breath. Evaluated and given oral lasix and he was released.   He presents today for a follow-up visit with a chief complaint of minimal fatigue upon moderate exertion. He describes this as chronic in nature having been present for several years. He has associated cough, shortness of breath, pedal edema (improving) and abdominal distention (improving) along with this. He denies any difficulty sleeping, palpitations, chest pain, dizziness or weight gain.   Has been walking more and continues to monitor his sodium intact. Wearing compression socks daily with improvement of his pedal edema.   Goes to the beach often as they have a place in [facility].   Past Medical History:  Diagnosis Date   Allergy    Asthma    Atrial fibrillation (HCC)    CHF (congestive heart failure) ([place])    Hypertension    Past Surgical History:  Procedure Laterality Date   skin cancer, face     TONSILLECTOMY AND ADENOIDECTOMY     Family History  Problem Relation Age of Onset   Hypertension Mother    Heart disease Mother    Cancer Father        Lung   Cancer Brother        brain   Cancer Daughter        Lung/Bone   CAD Brother 53   Social History   Tobacco Use   Smoking status: Never   Smokeless tobacco: Never  Substance Use Topics   Alcohol use: No    Alcohol/week: 0.0 standard drinks   Allergies  Allergen Reactions   Naproxen Sodium Other (See Comments)    Sweating and rapid heart beat   Prior to Admission medications   Medication Sig Start Date End  Date Taking? Authorizing Provider  ADVAIR DISKUS 250-50 MCG/DOSE AEPB INHALE 1 PUFF TWICE DAILY. Patient taking differently: 2 puffs in the morning and at bedtime. 02/25/18  Yes Jeffery, Chelle, PA  albuterol (VENTOLIN HFA) 108 (90 Base) MCG/ACT inhaler Inhale 1-2 puffs into the lungs every 6 (six) hours as needed for wheezing or shortness of breath. Needed 1-2 times in past month   Yes [provider]  apixaban (ELIQUIS) 5 MG TABS tablet Take 5 mg by mouth 2 (two) times daily.   Yes [provider]  fluticasone (FLONASE) 50 MCG/ACT nasal spray Place 2 sprays into both nostrils daily. Patient taking differently: Place 1 spray into both nostrils daily. 01/23/17  Yes Jeffery, Chelle, PA  furosemide (LASIX) 40 MG tablet Take 1 tablet (40 mg total) by mouth daily. 02/13/22 03/15/22 Yes Rada Hay, MD  metoprolol succinate (TOPROL-XL) 50 MG 24 hr tablet Take 50 mg by mouth daily. Take with or immediately following a meal.   Yes [provider]  sacubitril-valsartan (ENTRESTO) 24-26 MG Take 1 tablet by mouth 2 (two) times daily. 02/14/22  Yes Darylene Price A, FNP  triamcinolone cream (KENALOG) 0.1 % Apply 1 application. topically 2 (two) times daily. 02/13/22  Yes Acquanetta Belling  Rose, MD  aspirin 81 MG EC tablet Take 1 tablet (81 mg total) by mouth daily. Swallow whole. Patient not taking: Reported on 03/06/2022 01/27/13   Copland, Gay Filler, MD   Review of Systems  Constitutional:  Positive for fatigue. Negative for appetite change.  HENT:  Negative for congestion, postnasal drip and sore throat.   Eyes: Negative.   Respiratory:  Positive for cough and shortness of breath. Negative for chest tightness.   Cardiovascular:  Positive for leg swelling (improving). Negative for chest pain and palpitations.  Gastrointestinal:  Positive for abdominal distention. Negative for abdominal pain.  Endocrine: Negative.   Genitourinary: Negative.   Musculoskeletal:  Negative for back pain  and neck pain.  Skin: Negative.   Allergic/Immunologic: Negative.   Neurological:  Negative for dizziness and light-headedness.  Hematological:  Negative for adenopathy. Does not bruise/bleed easily.  Psychiatric/Behavioral:  Negative for dysphoric mood and sleep disturbance. The patient is not nervous/anxious.    Vitals:   03/06/22 0919  BP: 114/69  Pulse: 70  Resp: 16  Weight: 280 lb 4 oz (127.1 kg)  Height: '5\' 10"'$  (1.778 m)   Wt Readings from Last 3 Encounters:  03/06/22 280 lb 4 oz (127.1 kg)  02/19/22 285 lb 2 oz (129.3 kg)  02/14/22 288 lb (130.6 kg)   Lab Results  Component Value Date   CREATININE 0.83 02/19/2022   CREATININE 0.77 02/13/2022   CREATININE 0.75 (L) 01/23/2017   Physical Exam Vitals and nursing note reviewed. Exam conducted with a chaperone present (wife).  Constitutional:      Appearance: Normal appearance.  HENT:     Head: Normocephalic and atraumatic.  Cardiovascular:     Rate and Rhythm: Normal rate and regular rhythm.  Pulmonary:     Effort: Pulmonary effort is normal. No respiratory distress.     Breath sounds: No wheezing or rales.  Abdominal:     General: There is no distension.     Palpations: Abdomen is soft.     Tenderness: There is no abdominal tenderness.  Musculoskeletal:        General: No tenderness.     Cervical back: Normal range of motion and neck supple.     Right lower leg: Edema (1+ pitting) present.     Left lower leg: Edema (1+ pitting) present.  Neurological:     General: No focal deficit present.     Mental Status: He is alert and oriented to person, place, and time.  Psychiatric:        Mood and Affect: Mood normal.        Behavior: Behavior normal.        Thought Content: Thought content normal.   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)- - NYHA class II - euvolemic today - weighing daily; reminded to call for an overnight weight gain of > 2  pounds or a weekly weight gain of > 5 pounds - weight down 5 pounds from last visit here 2 weeks ago - on GDMT of entresto; discussed adding SGLT2 but will defer until next visit - not adding salt to his food and trying to be mindful of sodium content of foods - wearing compression socks daily with removal at bedtime with improvement in pedal edema - doing more walking for exercise - will check BMP today since he's been on entresto - reviewed echo results with patient and his wife - BNP 02/13/22 was 187.2 - PharmD  reconciled medications with the patient  2: HTN- - BP looks good (114/69) - saw PCP Jacqulynn Cadet @ [facility]) 02/27/22 - BMP 02/19/22 reviewed and showed sodium 136, potassium 3.9, creatinine 0.83 & GFR >60  3: Atrial fibrillation- - back in 2014, he was on xarelto but then developed severe gum bleeding so it was stopped - saw cardiology ([place]) on 02/28/22 & eliquis was started without any bleeding issues thus far   Medication list reviewed.   Return in 3 months, sooner if needed.

## 2022-03-06 ENCOUNTER — Ambulatory Visit (HOSPITAL_BASED_OUTPATIENT_CLINIC_OR_DEPARTMENT_OTHER): Payer: PPO | Admitting: Family

## 2022-03-06 ENCOUNTER — Other Ambulatory Visit
Admission: RE | Admit: 2022-03-06 | Discharge: 2022-03-06 | Disposition: A | Discharge status: Home / Self Care | Payer: PPO | Source: Ambulatory Visit | Attending: Family | Admitting: Family

## 2022-03-06 ENCOUNTER — Encounter: Payer: Self-pay | Admitting: Family

## 2022-03-06 ENCOUNTER — Telehealth: Payer: Self-pay

## 2022-03-06 VITALS — BP 114/69 | HR 70 | Resp 16 | Ht 70.0 in | Wt 280.2 lb

## 2022-03-06 DIAGNOSIS — Z79899 Other long term (current) drug therapy: Secondary | ICD-10-CM | POA: Insufficient documentation

## 2022-03-06 DIAGNOSIS — I5032 Chronic diastolic (congestive) heart failure: Secondary | ICD-10-CM | POA: Insufficient documentation

## 2022-03-06 DIAGNOSIS — I480 Paroxysmal atrial fibrillation: Secondary | ICD-10-CM | POA: Diagnosis not present

## 2022-03-06 DIAGNOSIS — J45909 Unspecified asthma, uncomplicated: Secondary | ICD-10-CM | POA: Insufficient documentation

## 2022-03-06 DIAGNOSIS — I10 Essential (primary) hypertension: Secondary | ICD-10-CM

## 2022-03-06 DIAGNOSIS — Z7901 Long term (current) use of anticoagulants: Secondary | ICD-10-CM | POA: Insufficient documentation

## 2022-03-06 DIAGNOSIS — I110 Hypertensive heart disease with heart failure: Secondary | ICD-10-CM | POA: Insufficient documentation

## 2022-03-06 LAB — BASIC METABOLIC PANEL
Anion gap: 9 (ref 5–15)
BUN: 20 mg/dL (ref 8–23)
CO2: 26 mmol/L (ref 22–32)
Calcium: 9.3 mg/dL (ref 8.9–10.3)
Chloride: 101 mmol/L (ref 98–111)
Creatinine, Ser: 0.78 mg/dL (ref 0.61–1.24)
GFR, Estimated: >60 mL/min (ref >60)
Glucose, Bld: 93 mg/dL (ref 70–99)
Potassium: 3.8 mmol/L (ref 3.5–5.1)
Sodium: 136 mmol/L (ref 135–145)

## 2022-03-06 NOTE — Progress Notes (Signed)
Daniel Underwood - PHARMACIST COUNSELING NOTE  Guideline-Directed Medical Therapy/Evidence Based Medicine  ACE/ARB/ARNI: Sacubitril-valsartan 24-26 mg twice daily Beta Blocker: Metoprolol succinate 50 mg daily Aldosterone Antagonist: none Diuretic: Furosemide 40 mg daily SGLT2i: none  Adherence Assessment  Do you ever forget to take your medication? '[]'$ Yes '[x]'$ No  Do you ever skip doses due to side effects? '[]'$ Yes '[x]'$ No  Do you have trouble affording your medicines? '[]'$ Yes '[x]'$ No  Are you ever unable to pick up your medication due to transportation difficulties? '[]'$ Yes '[x]'$ No  Do you ever stop taking your medications because you don't believe they are helping? '[]'$ Yes '[x]'$ No  Do you check your weight daily? '[x]'$ Yes '[]'$ No   Adherence strategy: wife assists in patients medication management  Barriers to obtaining medications: n/a  Vital signs: HR 70, BP 114/69, weight (pounds) 280 lb ECHO: Date 5/22, EF 55-60%, notes mild LVH/LAE     Latest Ref Rng & Units 03/06/2022   10:28 AM 02/19/2022   12:59 PM 02/13/2022    1:09 PM  BMP  Glucose 70 - 99 mg/dL 93   105   130    BUN 8 - 23 mg/dL '20   20   18    '$ Creatinine 0.61 - 1.24 mg/dL 0.78   0.83   0.77    Sodium 135 - 145 mmol/L 136   136   135    Potassium 3.5 - 5.1 mmol/L 3.8   3.9   3.8    Chloride 98 - 111 mmol/L 101   98   101    CO2 22 - 32 mmol/L '26   29   25    '$ Calcium 8.9 - 10.3 mg/dL 9.3   9.2   9.2      Past Medical History:  Diagnosis Date   Allergy    Asthma    Atrial fibrillation (HCC)    CHF (congestive heart failure) ([place])    Hypertension     ASSESSMENT 76 year old male who presents to the HF clinic for follow up. PMH relevant to HFpEF, asthma, HTN, and atrial fibrillation. Not taking ibuprofen after being educated to avoid this medication during previous visit. On Entresto and metoprolol succinate with blood pressure of 114/69 during this visit. Wife inquired about grapefruit  juice's ability to interact with medications.   Recent ED Visit (past 6 months): Date - 02/13/22, [facility] - leg swelling  PLAN Obtain BMP Consider initiation of SGLT2i at next visit for morality reduction in HFrEF Recommend avoiding grapefruit juice. Grapefruit inhibits CYP3A4, an enzyme that metabolizes many medications, thus increasing drug levels. Continue avoiding ibuprofen. Recommend over the counter acetaminophen for minor aches/pains (maximum daily dose 3 g/day)  Time spent: 20 minutes  Wynelle Cleveland, PharmD Pharmacy Resident  03/06/2022 2:18 PM   Current Outpatient Medications:    ADVAIR DISKUS 250-50 MCG/DOSE AEPB, INHALE 1 PUFF TWICE DAILY. (Patient taking differently: 2 puffs in the morning and at bedtime.), Disp: 60 each, Rfl: 0   albuterol (VENTOLIN HFA) 108 (90 Base) MCG/ACT inhaler, Inhale 1-2 puffs into the lungs every 6 (six) hours as needed for wheezing or shortness of breath. Needed 1-2 times in past month, Disp: , Rfl:    apixaban (ELIQUIS) 5 MG TABS tablet, Take 5 mg by mouth 2 (two) times daily., Disp: , Rfl:    aspirin 81 MG EC tablet, Take 1 tablet (81 mg total) by mouth daily. Swallow whole. (Patient not taking: Reported on 03/06/2022), Disp: 30 tablet,  Rfl: 12   fluticasone (FLONASE) 50 MCG/ACT nasal spray, Place 2 sprays into both nostrils daily. (Patient taking differently: Place 1 spray into both nostrils daily.), Disp: 16 g, Rfl: 12   furosemide (LASIX) 40 MG tablet, Take 1 tablet (40 mg total) by mouth daily., Disp: 30 tablet, Rfl: 0   metoprolol succinate (TOPROL-XL) 50 MG 24 hr tablet, Take 50 mg by mouth daily. Take with or immediately following a meal., Disp: , Rfl:    sacubitril-valsartan (ENTRESTO) 24-26 MG, Take 1 tablet by mouth 2 (two) times daily., Disp: 60 tablet, Rfl: 3   triamcinolone cream (KENALOG) 0.1 %, Apply 1 application. topically 2 (two) times daily., Disp: 30 g, Rfl: 0   COUNSELING POINTS/CLINICAL PEARLS   DRUGS TO CAUTION IN HEART  FAILURE  Drug or Class Mechanism  Analgesics NSAIDs COX-2 inhibitors Glucocorticoids  Sodium and water retention, increased systemic vascular resistance, decreased response to diuretics   Diabetes Medications Metformin Thiazolidinediones Rosiglitazone (Avandia) Pioglitazone (Actos) DPP4 Inhibitors Saxagliptin (Onglyza) Sitagliptin (Januvia)   Lactic acidosis Possible calcium channel blockade   Unknown  Antiarrhythmics Class I  Flecainide Disopyramide Class III Sotalol Other Dronedarone  Negative inotrope, proarrhythmic   Proarrhythmic, beta blockade  Negative inotrope  Antihypertensives Alpha Blockers Doxazosin Calcium Channel Blockers Diltiazem Verapamil Nifedipine Central Alpha Adrenergics Moxonidine Peripheral Vasodilators Minoxidil  Increases renin and aldosterone  Negative inotrope    Possible sympathetic withdrawal  Unknown  Anti-infective Itraconazole Amphotericin B  Negative inotrope Unknown  Hematologic Anagrelide Cilostazol   Possible inhibition of PD IV Inhibition of PD III causing arrhythmias  Neurologic/Psychiatric Stimulants Anti-Seizure Drugs Carbamazepine Pregabalin Antidepressants Tricyclics Citalopram Parkinsons Bromocriptine Pergolide Pramipexole Antipsychotics Clozapine Antimigraine Ergotamine Methysergide Appetite suppressants Bipolar Lithium  Peripheral alpha and beta agonist activity  Negative inotrope and chronotrope Calcium channel blockade  Negative inotrope, proarrhythmic Dose-dependent QT prolongation  Excessive serotonin activity/valvular damage Excessive serotonin activity/valvular damage Unknown  IgE mediated hypersensitivy, calcium channel blockade  Excessive serotonin activity/valvular damage Excessive serotonin activity/valvular damage Valvular damage  Direct myofibrillar degeneration, adrenergic stimulation  Antimalarials Chloroquine Hydroxychloroquine Intracellular inhibition  of lysosomal enzymes  Urologic Agents Alpha Blockers Doxazosin Prazosin Tamsulosin Terazosin  Increased renin and aldosterone  Adapted from Page Carleene Overlie, et al. "Drugs That May Cause or Exacerbate Heart Failure: A Scientific Statement from the American Heart  Association." Circulation 2016; 134:e32-e69. DOI: 10.1161/CIR.0000000000000426   MEDICATION ADHERENCES TIPS AND STRATEGIES Taking medication as prescribed improves patient outcomes in heart failure (reduces hospitalizations, improves symptoms, increases survival) Side effects of medications can be managed by decreasing doses, switching agents, stopping drugs, or adding additional therapy. Please let someone in the [facility] know if you have having bothersome side effects so we can modify your regimen. Do not alter your medication regimen without talking to [place].  Medication reminders can help patients remember to take drugs on time. If you are missing or forgetting doses you can try linking behaviors, using pill boxes, or an electronic reminder like an alarm on your phone or an app. Some people can also get automated phone calls as medication reminders.

## 2022-03-06 NOTE — Patient Instructions (Signed)
Continue weighing daily and call for an overnight weight gain of 3 pounds or more or a weekly weight gain of more than 5 pounds.   If you have voicemail, please make sure your mailbox is cleaned out so that we may leave a message and please make sure to listen to any voicemails.     

## 2022-03-06 NOTE — Telephone Encounter (Addendum)
Spoke with patient's wife and patient to report normal lab results. Patient's wife acknowledged and stated they had no  questions. Reminded them to call our clinic if any questions or concerns arise.  Georg Ruddle, RN  ----- Message from Alisa Graff, [place] sent at 03/06/2022 12:30 PM EDT ----- Please call and tell him that his labs look great!

## 2022-05-10 IMAGING — DX DG CHEST 1V PORT
1 series · 1 of 1 positions shown · non-contrast
Comparison: January 27, 2013.

CLINICAL DATA: A 76-year-old male presents with swelling.

EXAM:
PORTABLE CHEST 1 VIEW

[chest ap]
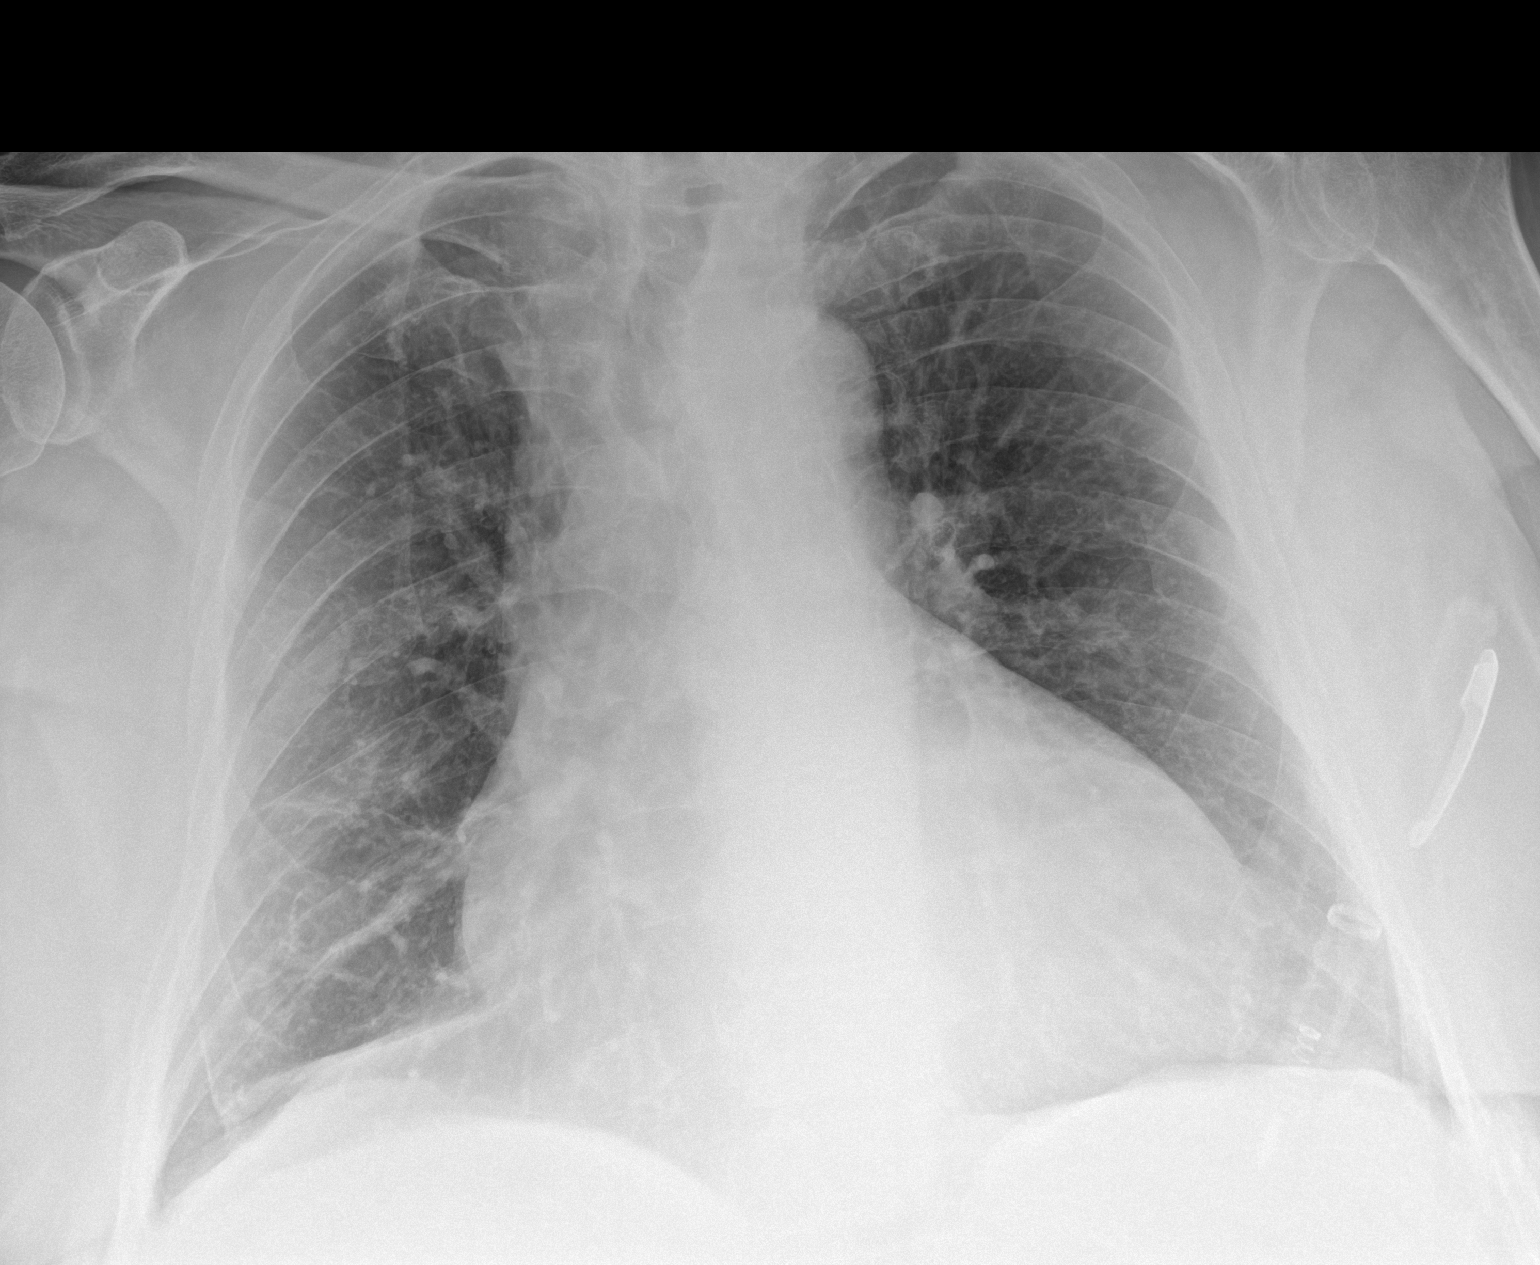

[1 of 1 positions shown; findings below may reference images not displayed]

FINDINGS: Heart size mildly enlarged accentuated by portable technique in AP
projection.

Hilar structures are normal.

No lobar consolidation. No visible pneumothorax. Mild RIGHT basilar
atelectasis.

On limited assessment there is no acute skeletal finding.
IMPRESSION: Mild cardiomegaly and RIGHT basilar atelectasis.

## 2022-06-06 ENCOUNTER — Other Ambulatory Visit
Admission: RE | Admit: 2022-06-06 | Discharge: 2022-06-06 | Disposition: A | Discharge status: Home / Self Care | Payer: PPO | Source: Ambulatory Visit | Attending: Family | Admitting: Family

## 2022-06-06 ENCOUNTER — Ambulatory Visit (HOSPITAL_BASED_OUTPATIENT_CLINIC_OR_DEPARTMENT_OTHER): Payer: PPO | Admitting: Family

## 2022-06-06 ENCOUNTER — Encounter: Payer: Self-pay | Admitting: Family

## 2022-06-06 VITALS — BP 111/75 | HR 71 | Resp 20 | Ht 70.0 in | Wt 252.0 lb

## 2022-06-06 DIAGNOSIS — K068 Other specified disorders of gingiva and edentulous alveolar ridge: Secondary | ICD-10-CM | POA: Insufficient documentation

## 2022-06-06 DIAGNOSIS — I4891 Unspecified atrial fibrillation: Secondary | ICD-10-CM | POA: Insufficient documentation

## 2022-06-06 DIAGNOSIS — I10 Essential (primary) hypertension: Secondary | ICD-10-CM | POA: Diagnosis not present

## 2022-06-06 DIAGNOSIS — I480 Paroxysmal atrial fibrillation: Secondary | ICD-10-CM

## 2022-06-06 DIAGNOSIS — I110 Hypertensive heart disease with heart failure: Secondary | ICD-10-CM | POA: Insufficient documentation

## 2022-06-06 DIAGNOSIS — J45909 Unspecified asthma, uncomplicated: Secondary | ICD-10-CM | POA: Insufficient documentation

## 2022-06-06 DIAGNOSIS — Z7901 Long term (current) use of anticoagulants: Secondary | ICD-10-CM | POA: Insufficient documentation

## 2022-06-06 DIAGNOSIS — I5032 Chronic diastolic (congestive) heart failure: Secondary | ICD-10-CM | POA: Insufficient documentation

## 2022-06-06 LAB — LIPID PANEL
Cholesterol: 202 mg/dL — ABNORMAL HIGH (ref 0–200)
HDL: 51 mg/dL (ref >40)
LDL Cholesterol: 140 mg/dL — ABNORMAL HIGH (ref 0–99)
Total CHOL/HDL Ratio: 4 RATIO
Triglycerides: 57 mg/dL (ref <150)
VLDL: 11 mg/dL (ref 0–40)

## 2022-06-06 MED ORDER — FUROSEMIDE 40 MG PO TABS: 40.0000 mg | ORAL_TABLET | Freq: Every day | ORAL | 3 refills | Status: AC

## 2022-06-06 MED ORDER — SACUBITRIL-VALSARTAN 24-26 MG PO TABS: 1.0000 | ORAL_TABLET | Freq: Two times a day (BID) | ORAL | 3 refills | Status: DC

## 2022-06-06 NOTE — Patient Instructions (Signed)
Continue weighing daily and call for an overnight weight gain of 3 pounds or more or a weekly weight gain of more than 5 pounds.  °

## 2022-06-06 NOTE — Progress Notes (Signed)
Patient ID: Daniel Underwood, male    DOB: 11/06/1945, 76 y.o.   MRN: [id number]  HPI  Daniel Underwood is a 76 y/o male with a history of asthma, HTN, atrial fibrillation and chronic heart failure.   Echo report from 02/26/22 reviewed and showed an EF of 55-60% along with mild LVH/LAE. Echo report from 01/29/13 reviewed and showed an EF of 55-60% along with mild LAE.  Was in the ED 02/13/22 due to leg edema, rattling cough, weight gain and shortness of breath. Evaluated and given oral lasix and he was released.   He presents today for a follow-up visit with no complaints. He denies any fatigue, headaches, dizziness, cough, SOB, chest pain/pressure, palpitations, abdominal distention, difficulty sleeping, swelling in lower extremities, nor weight gain.   Following low-sodium diet and not adding salt. Drinks mostly water but does not monitor fluid intake. Monitors weight at home daily. Walking as tolerated daily. Wears compression socks and says it keeps the swelling down.    Past Medical History:  Diagnosis Date   Allergy    Asthma    Atrial fibrillation (HCC)    CHF (congestive heart failure) ([place])    Hypertension    Past Surgical History:  Procedure Laterality Date   skin cancer, face     TONSILLECTOMY AND ADENOIDECTOMY     Family History  Problem Relation Age of Onset   Hypertension Mother    Heart disease Mother    Cancer Father        Lung   Cancer Brother        brain   Cancer Daughter        Lung/Bone   CAD Brother 84   Social History   Tobacco Use   Smoking status: Never   Smokeless tobacco: Never  Substance Use Topics   Alcohol use: No    Alcohol/week: 0.0 standard drinks of alcohol   Allergies  Allergen Reactions   Naproxen Sodium Other (See Comments)    Sweating and rapid heart beat   Prior to Admission medications   Medication Sig Start Date End Date Taking? Authorizing Provider  albuterol (VENTOLIN HFA) 108 (90 Base) MCG/ACT inhaler Inhale 1-2 puffs into the lungs  every 6 (six) hours as needed for wheezing or shortness of breath. Needed 1-2 times in past month   Yes [provider]  apixaban (ELIQUIS) 5 MG TABS tablet Take 5 mg by mouth 2 (two) times daily.   Yes [provider]  fluticasone (FLONASE) 50 MCG/ACT nasal spray Place 2 sprays into both nostrils daily. Patient taking differently: Place 1 spray into both nostrils daily. 01/23/17  Yes Jeffery, Chelle, PA  metoprolol succinate (TOPROL-XL) 50 MG 24 hr tablet Take 50 mg by mouth daily. Take with or immediately following a meal.   Yes [provider]  sacubitril-valsartan (ENTRESTO) 24-26 MG Take 1 tablet by mouth 2 (two) times daily. 02/14/22  Yes Darylene Price A, FNP  triamcinolone cream (KENALOG) 0.1 % Apply 1 application. topically 2 (two) times daily. 02/13/22  Yes Rada Hay, MD  ADVAIR DISKUS 250-50 MCG/DOSE AEPB INHALE 1 PUFF TWICE DAILY. Patient not taking: Reported on 06/06/2022 02/25/18   Harrison Mons, PA  aspirin 81 MG EC tablet Take 1 tablet (81 mg total) by mouth daily. Swallow whole. Patient not taking: Reported on 03/06/2022 01/27/13   Copland, Gay Filler, MD  furosemide (LASIX) 40 MG tablet Take 1 tablet (40 mg total) by mouth daily. 06/06/22   Alisa Graff,  FNP   Review of Systems  Constitutional:  Negative for appetite change and fatigue.  HENT:  Negative for congestion, postnasal drip and sore throat.   Eyes: Negative.   Respiratory:  Negative for cough, chest tightness and shortness of breath.   Cardiovascular:  Negative for chest pain, palpitations and leg swelling (improving).  Gastrointestinal:  Negative for abdominal distention and abdominal pain.  Endocrine: Negative.   Genitourinary: Negative.   Musculoskeletal:  Negative for back pain and neck pain.  Skin: Negative.   Allergic/Immunologic: Negative.   Neurological:  Negative for dizziness and light-headedness.  Hematological:  Negative for adenopathy. Does not bruise/bleed easily.   Psychiatric/Behavioral:  Negative for dysphoric mood and sleep disturbance. The patient is not nervous/anxious.     Vitals:   06/06/22 1025  BP: 111/75  Pulse: 71  Resp: 20  SpO2: 100%    Wt Readings from Last 3 Encounters:  06/06/22 252 lb (114.3 kg)  03/06/22 280 lb 4 oz (127.1 kg)  02/19/22 285 lb 2 oz (129.3 kg)    Lab Results  Component Value Date   CREATININE 0.78 03/06/2022   CREATININE 0.83 02/19/2022   CREATININE 0.77 02/13/2022   Physical Exam Vitals and nursing note reviewed. Exam conducted with a chaperone present (wife).  Constitutional:      Appearance: Normal appearance.  HENT:     Head: Normocephalic and atraumatic.  Cardiovascular:     Rate and Rhythm: Normal rate. Rhythm irregular.  Pulmonary:     Effort: Pulmonary effort is normal. No respiratory distress.     Breath sounds: No wheezing or rales.  Abdominal:     General: There is no distension.     Palpations: Abdomen is soft.     Tenderness: There is no abdominal tenderness.  Musculoskeletal:        General: No tenderness.     Cervical back: Normal range of motion and neck supple.     Right lower leg: Edema (1+ pitting) present.     Left lower leg: Edema (1+ pitting) present.  Neurological:     General: No focal deficit present.     Mental Status: He is alert and oriented to person, place, and time.  Psychiatric:        Mood and Affect: Mood normal.        Behavior: Behavior normal.        Thought Content: Thought content normal.    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)- - NYHA class I - euvolemic today - weighing daily; reminded to call for an overnight weight gain of > 2 pounds or a weekly weight gain of > 5 pounds - weight down 28 pounds from last visit here 03/06/22 - on GDMT of entresto; discussed adding SGLT2 but he is hesitant to start anything else - not adding salt to his food and trying to be mindful of  sodium content of foods - wearing compression socks daily with removal at bedtime with improvement in pedal edema - doing more walking for exercise - BNP 02/13/22 was 187.2 - PharmD reconciled medications with the patient - Ordered Lipid panel  2: HTN- - BP looks good (111/75) - saw PCP Jacqulynn Cadet @ [facility]) 04/17/22 - BMP 03/06/22 reviewed and showed sodium 136, potassium 3.8, creatinine 0.78 & GFR >60  3: Atrial fibrillation- - back in 2014, he was on xarelto but then developed severe gum bleeding so it was stopped - saw cardiology ([place])  on 02/28/22 & eliquis was started without any bleeding issues thus far   Medication list reviewed.   Return in 3 months, sooner if needed.

## 2022-09-04 ENCOUNTER — Encounter: Payer: Self-pay | Admitting: Family

## 2022-09-04 ENCOUNTER — Ambulatory Visit (HOSPITAL_BASED_OUTPATIENT_CLINIC_OR_DEPARTMENT_OTHER): Payer: PPO | Admitting: Family

## 2022-09-04 ENCOUNTER — Other Ambulatory Visit
Admission: RE | Admit: 2022-09-04 | Discharge: 2022-09-04 | Disposition: A | Discharge status: Home / Self Care | Payer: PPO | Source: Ambulatory Visit | Attending: Family | Admitting: Family

## 2022-09-04 ENCOUNTER — Encounter: Payer: Self-pay | Admitting: Pharmacist

## 2022-09-04 VITALS — BP 115/76 | HR 69 | Resp 18 | Wt 229.4 lb

## 2022-09-04 DIAGNOSIS — I10 Essential (primary) hypertension: Secondary | ICD-10-CM | POA: Diagnosis not present

## 2022-09-04 DIAGNOSIS — I480 Paroxysmal atrial fibrillation: Secondary | ICD-10-CM

## 2022-09-04 DIAGNOSIS — I5032 Chronic diastolic (congestive) heart failure: Secondary | ICD-10-CM

## 2022-09-04 LAB — BASIC METABOLIC PANEL
Anion gap: 8 (ref 5–15)
BUN: 20 mg/dL (ref 8–23)
CO2: 26 mmol/L (ref 22–32)
Calcium: 9.4 mg/dL (ref 8.9–10.3)
Chloride: 104 mmol/L (ref 98–111)
Creatinine, Ser: 0.78 mg/dL (ref 0.61–1.24)
GFR, Estimated: >60 mL/min (ref >60)
Glucose, Bld: 88 mg/dL (ref 70–99)
Potassium: 3.7 mmol/L (ref 3.5–5.1)
Sodium: 138 mmol/L (ref 135–145)

## 2022-09-04 LAB — LIPID PANEL
Cholesterol: 215 mg/dL — ABNORMAL HIGH (ref 0–200)
HDL: 51 mg/dL (ref >40)
LDL Cholesterol: 151 mg/dL — ABNORMAL HIGH (ref 0–99)
Total CHOL/HDL Ratio: 4.2 RATIO
Triglycerides: 67 mg/dL (ref <150)
VLDL: 13 mg/dL (ref 0–40)

## 2022-09-04 NOTE — Patient Instructions (Signed)
Continue weighing daily and call for an overnight weight gain of 3 pounds or more or a weekly weight gain of more than 5 pounds.   If you have voicemail, please make sure your mailbox is cleaned out so that we may leave a message and please make sure to listen to any voicemails.     

## 2022-09-04 NOTE — Progress Notes (Signed)
Patient ID: Daniel Underwood, male    DOB: 08-Oct-1946, 76 y.o.   MRN: [id number]  HPI  [name] is a 76 y/o male with a history of asthma, HTN, atrial fibrillation and chronic heart failure.   Echo report from 02/26/22 reviewed and showed an EF of 55-60% along with mild LVH/LAE. Echo report from 01/29/13 reviewed and showed an EF of 55-60% along with mild LAE.  Has not been admitted or been in the ED in the last 6 months.   He presents today for a follow-up visit with a chief complaint of minimal shortness of breath with heavy exertion. Reports this as chronic in nature although much improved over the las 6 months. He has associated intermittent dry cough along with this. He denies any difficulty sleeping, dizziness, abdominal distention, palpitations, pedal edema, chest pain, fatigue or weight gain.   Overall, feels great and has continued to monitor his diet carefully. Monitoring his sodium content as well as sugars and rarely eats bread.   Past Medical History:  Diagnosis Date   Allergy    Asthma    Atrial fibrillation (HCC)    CHF (congestive heart failure) ([place])    Hypertension    Past Surgical History:  Procedure Laterality Date   skin cancer, face     TONSILLECTOMY AND ADENOIDECTOMY     Family History  Problem Relation Age of Onset   Hypertension Mother    Heart disease Mother    Cancer Father        Lung   Cancer Brother        brain   Cancer Daughter        Lung/Bone   CAD Brother 37   Social History   Tobacco Use   Smoking status: Never   Smokeless tobacco: Never  Substance Use Topics   Alcohol use: No    Alcohol/week: 0.0 standard drinks of alcohol   Allergies  Allergen Reactions   Naproxen Sodium Other (See Comments)    Sweating and rapid heart beat   Prior to Admission medications   Medication Sig Start Date End Date Taking? Authorizing Provider  ADVAIR DISKUS 250-50 MCG/DOSE AEPB INHALE 1 PUFF TWICE DAILY. 02/25/18  Yes Jeffery, Domingo Mend, PA  apixaban  (ELIQUIS) 5 MG TABS tablet Take 5 mg by mouth 2 (two) times daily.   Yes [provider]  fluticasone (FLONASE) 50 MCG/ACT nasal spray Place 2 sprays into both nostrils daily. Patient taking differently: Place 1 spray into both nostrils daily. 01/23/17  Yes Jeffery, Chelle, PA  furosemide (LASIX) 40 MG tablet Take 1 tablet (40 mg total) by mouth daily. 06/06/22  Yes Darylene Price A, FNP  metoprolol succinate (TOPROL-XL) 50 MG 24 hr tablet Take 50 mg by mouth daily. Take with or immediately following a meal.   Yes [provider]  sacubitril-valsartan (ENTRESTO) 24-26 MG Take 1 tablet by mouth 2 (two) times daily. 06/06/22  Yes Darylene Price A, FNP  triamcinolone cream (KENALOG) 0.1 % Apply 1 application. topically 2 (two) times daily. 02/13/22  Yes Rada Hay, MD  albuterol (VENTOLIN HFA) 108 (90 Base) MCG/ACT inhaler Inhale 1-2 puffs into the lungs every 6 (six) hours as needed for wheezing or shortness of breath. Needed 1-2 times in past month Patient not taking: Reported on 09/04/2022    [provider]    Review of Systems  Constitutional:  Negative for appetite change and fatigue.  HENT:  Negative for congestion, postnasal drip and sore throat.  Eyes: Negative.   Respiratory:  Positive for cough (occasional) and shortness of breath (with heavy exertion). Negative for chest tightness.   Cardiovascular:  Negative for chest pain, palpitations and leg swelling.  Gastrointestinal:  Negative for abdominal distention and abdominal pain.  Endocrine: Negative.   Genitourinary: Negative.   Musculoskeletal:  Negative for back pain and neck pain.  Skin: Negative.   Allergic/Immunologic: Negative.   Neurological:  Negative for dizziness and light-headedness.  Hematological:  Negative for adenopathy. Does not bruise/bleed easily.  Psychiatric/Behavioral:  Negative for dysphoric mood and sleep disturbance. The patient is not nervous/anxious.    Vitals:   09/04/22 1013   BP: 115/76  Pulse: 69  Resp: 18  SpO2: 94%  Weight: 229 lb 6 oz (104 kg)   Wt Readings from Last 3 Encounters:  09/04/22 229 lb 6 oz (104 kg)  06/06/22 252 lb (114.3 kg)  03/06/22 280 lb 4 oz (127.1 kg)    Lab Results  Component Value Date   CREATININE 0.78 03/06/2022   CREATININE 0.83 02/19/2022   CREATININE 0.77 02/13/2022   Physical Exam Vitals and nursing note reviewed. Exam conducted with a chaperone present (wife).  Constitutional:      Appearance: Normal appearance.  HENT:     Head: Normocephalic and atraumatic.  Cardiovascular:     Rate and Rhythm: Normal rate. Rhythm irregular.  Pulmonary:     Effort: Pulmonary effort is normal. No respiratory distress.     Breath sounds: No wheezing or rales.  Abdominal:     General: There is no distension.     Palpations: Abdomen is soft.     Tenderness: There is no abdominal tenderness.  Musculoskeletal:        General: No tenderness.     Cervical back: Normal range of motion and neck supple.     Right lower leg: Edema (trace pitting) present.     Left lower leg: Edema (trace pitting) present.  Neurological:     General: No focal deficit present.     Mental Status: He is alert and oriented to person, place, and time.  Psychiatric:        Mood and Affect: Mood normal.        Behavior: Behavior normal.        Thought Content: Thought content normal.    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)- - NYHA class II - euvolemic today - weighing daily; reminded to call for an overnight weight gain of > 2 pounds or a weekly weight gain of > 5 pounds - weight down 22.4 pounds from last visit here 3 months ago; total of 58 pounds from initial visit here 6 months ago - on GDMT of entresto; discussed adding SGLT2 again but he is hesitant to start anything else - 1 month samples of entresto provided today as he is in the donut hole and doesn't qualify for patient  assistance - will check BMP/ LDL today; discussed statin use if LDL is still elevated and he says that he might consider starting it - not adding salt to his food and trying to be mindful of sodium content of foods; also watching sugar/carb intake - wearing compression socks daily with removal at bedtime  - doing more walking for exercise - BNP 02/13/22 was 187.2 - PharmD reconciled medications with the patient - does not take flu vaccine  2: HTN- - BP looks good (115/76) - saw PCP Jacqulynn Cadet @  [facility]) 04/17/22 - BMP 03/06/22 reviewed and showed sodium 136, potassium 3.8, creatinine 0.78 & GFR >60 - check BMP today  3: Atrial fibrillation- - tolerating apixaban BID without unusual bleeding; 1 month samples provided today - saw cardiology ([place]) on 07/04/22; returns 10/30/22   Medication list reviewed.   Return in 4 months, sooner if needed.

## 2022-09-05 ENCOUNTER — Telehealth: Payer: Self-pay | Admitting: Pharmacist

## 2022-09-05 NOTE — Telephone Encounter (Signed)
Called patein to discussed results of lipid panel and option to start low dose rosuvastatin. Patient still not interested in adding more medication to his regimen, but willing to adjust diet.   Currently on low carbs and high protein diet. Eating 2-3 eggs every morning. Patient plans to decrease amount of eggs, cheeses and fatty foods. Recommended to increased more plant based options.  Will repeat Lipid panel in 3-4 months.  All other results WNL.

## 2022-09-06 NOTE — Progress Notes (Signed)
Patient ID: Daniel Underwood, male   DOB: June 19, 1946, 76 y.o.   MRN: [id number] [name] COUNSELING NOTE  Guideline-Directed Medical Therapy/Evidence Based Medicine  ACE/ARB/ARNI: Sacubitril-valsartan 24-26 mg twice daily Beta Blocker: Metoprolol succinate 50 mg daily Aldosterone Antagonist: none Diuretic: Furosemide 40 mg daily SGLT2i: none  Adherence Assessment  Do you ever forget to take your medication? '[]'$ Yes '[x]'$ No  Do you ever skip doses due to side effects? '[]'$ Yes '[x]'$ No  Do you have trouble affording your medicines? '[]'$ Yes '[x]'$ No  Are you ever unable to pick up your medication due to transportation difficulties? '[]'$ Yes '[x]'$ No  Do you ever stop taking your medications because you don't believe they are helping? '[]'$ Yes '[x]'$ No  Do you check your weight daily? '[x]'$ Yes '[]'$ No   Adherence strategy: wife assists in patients medication management  Barriers to obtaining medications: n/a  Vital signs: HR 69, BP 115/76, weight (pounds) 229 lb  ECHO: Date 5/22, EF 55-60%, notes mild LVH/LAE     Latest Ref Rng & Units 09/04/2022   11:36 AM 03/06/2022   10:28 AM 02/19/2022   12:59 PM  BMP  Glucose 70 - 99 mg/dL 88  93  105   BUN 8 - 23 mg/dL '20  20  20   '$ Creatinine 0.61 - 1.24 mg/dL 0.78  0.78  0.83   Sodium 135 - 145 mmol/L 138  136  136   Potassium 3.5 - 5.1 mmol/L 3.7  3.8  3.9   Chloride 98 - 111 mmol/L 104  101  98   CO2 22 - 32 mmol/L '26  26  29   '$ Calcium 8.9 - 10.3 mg/dL 9.4  9.3  9.2     Past Medical History:  Diagnosis Date   Allergy    Asthma    Atrial fibrillation (HCC)    CHF (congestive heart failure) ([place])    Hypertension     ASSESSMENT 76 year old male who presents to the HF clinic for follow up. PMH relevant to HFpEF, asthma, HTN, and atrial fibrillation. Last ED admission was 02/13/2022 for leg edema. Noted weight gain around Thanksgivings but back to baseline. In general patient had significant weight loss  and currently following low carb diet. Denies swelling or SOB at this time.   PLAN Add  SGLT2i a- patient refuses additional medication - don't want to take any more pills Total CHO and LDL above desired goal for cardiovascular prevention with current 10-Yr ASCVD risk 25% (high). Recommend to add statin (rosuvastatin '10mg'$  daily) to regimen.  Time spent: 15 minutes  Daniel Underwood PharmD, BCPS 09/06/2022 7:51 AM   Current Outpatient Medications:    ADVAIR DISKUS 250-50 MCG/DOSE AEPB, INHALE 1 PUFF TWICE DAILY., Disp: 60 each, Rfl: 0   albuterol (VENTOLIN HFA) 108 (90 Base) MCG/ACT inhaler, Inhale 1-2 puffs into the lungs every 6 (six) hours as needed for wheezing or shortness of breath. Needed 1-2 times in past month (Patient not taking: Reported on 09/04/2022), Disp: , Rfl:    apixaban (ELIQUIS) 5 MG TABS tablet, Take 5 mg by mouth 2 (two) times daily., Disp: , Rfl:    fluticasone (FLONASE) 50 MCG/ACT nasal spray, Place 2 sprays into both nostrils daily. (Patient taking differently: Place 1 spray into both nostrils daily.), Disp: 16 g, Rfl: 12   furosemide (LASIX) 40 MG tablet, Take 1 tablet (40 mg total) by mouth daily., Disp: 90 tablet, Rfl: 3   metoprolol succinate (TOPROL-XL) 50 MG 24 hr tablet,  Take 50 mg by mouth daily. Take with or immediately following a meal., Disp: , Rfl:    sacubitril-valsartan (ENTRESTO) 24-26 MG, Take 1 tablet by mouth 2 (two) times daily., Disp: 180 tablet, Rfl: 3   triamcinolone cream (KENALOG) 0.1 %, Apply 1 application. topically 2 (two) times daily., Disp: 30 g, Rfl: 0   MEDICATION ADHERENCES TIPS AND STRATEGIES Taking medication as prescribed improves patient outcomes in heart failure (reduces hospitalizations, improves symptoms, increases survival) Side effects of medications can be managed by decreasing doses, switching agents, stopping drugs, or adding additional therapy. Please let someone in the [facility] know if you have having  bothersome side effects so we can modify your regimen. Do not alter your medication regimen without talking to [place].  Medication reminders can help patients remember to take drugs on time. If you are missing or forgetting doses you can try linking behaviors, using pill boxes, or an electronic reminder like an alarm on your phone or an app. Some people can also get automated phone calls as medication reminders.

## 2023-01-01 ENCOUNTER — Ambulatory Visit: Payer: PPO | Attending: Family | Admitting: Family

## 2023-01-01 ENCOUNTER — Encounter: Payer: Self-pay | Admitting: Family

## 2023-01-01 VITALS — BP 116/82 | HR 66 | Wt 227.8 lb

## 2023-01-01 DIAGNOSIS — I110 Hypertensive heart disease with heart failure: Secondary | ICD-10-CM | POA: Insufficient documentation

## 2023-01-01 DIAGNOSIS — I5032 Chronic diastolic (congestive) heart failure: Secondary | ICD-10-CM

## 2023-01-01 DIAGNOSIS — I4891 Unspecified atrial fibrillation: Secondary | ICD-10-CM | POA: Insufficient documentation

## 2023-01-01 DIAGNOSIS — Z7901 Long term (current) use of anticoagulants: Secondary | ICD-10-CM | POA: Insufficient documentation

## 2023-01-01 DIAGNOSIS — I10 Essential (primary) hypertension: Secondary | ICD-10-CM

## 2023-01-01 DIAGNOSIS — Z79899 Other long term (current) drug therapy: Secondary | ICD-10-CM | POA: Insufficient documentation

## 2023-01-01 DIAGNOSIS — E785 Hyperlipidemia, unspecified: Secondary | ICD-10-CM | POA: Diagnosis not present

## 2023-01-01 DIAGNOSIS — I480 Paroxysmal atrial fibrillation: Secondary | ICD-10-CM

## 2023-01-01 NOTE — Progress Notes (Signed)
Patient ID: Daniel Underwood, male    DOB: 08-12-1946, 77 y.o.   MRN: [id number]  HPI  Daniel Underwood is a 77 y/o male with a history of asthma, HTN, atrial fibrillation and chronic heart failure.   Echo 02/26/22: EF of 55-60% along with mild LVH/LAE. Echo 01/29/13:  reviewed and showed an EF of 55-60% along with mild LAE.  Has not been admitted or been in the ED in the last 6 months.   He presents today for a HF follow-up visit with a chief complaint of a dry cough. Says this is chronic although occurs on an intermittent basis. Has no other symptoms and specifically denies difficulty sleeping, dizziness, abdominal distention, palpitations, pedal edema, chest pain, SOB, fatigue or weight gain.   Has decreased his consumption of eggs and is more closely monitoring intake of fatty foods.   Past Medical History:  Diagnosis Date   Allergy    Asthma    Atrial fibrillation (HCC)    CHF (congestive heart failure) ([place])    Hypertension    Past Surgical History:  Procedure Laterality Date   skin cancer, face     TONSILLECTOMY AND ADENOIDECTOMY     Family History  Problem Relation Age of Onset   Hypertension Mother    Heart disease Mother    Cancer Father        Lung   Cancer Brother        brain   Cancer Daughter        Lung/Bone   CAD Brother 47   Social History   Tobacco Use   Smoking status: Never   Smokeless tobacco: Never  Substance Use Topics   Alcohol use: No    Alcohol/week: 0.0 standard drinks of alcohol   Allergies  Allergen Reactions   Naproxen Sodium Other (See Comments)    Sweating and rapid heart beat   Prior to Admission medications   Medication Sig Start Date End Date Taking? Authorizing Provider  ADVAIR DISKUS 250-50 MCG/DOSE AEPB INHALE 1 PUFF TWICE DAILY. 02/25/18  Yes Jeffery, Domingo Mend, PA  apixaban (ELIQUIS) 5 MG TABS tablet Take 5 mg by mouth 2 (two) times daily.   Yes [provider]  fluticasone (FLONASE) 50 MCG/ACT nasal spray Place 2 sprays into  both nostrils daily. Patient taking differently: Place 1 spray into both nostrils daily. 01/23/17  Yes Jeffery, Chelle, PA  furosemide (LASIX) 40 MG tablet Take 1 tablet (40 mg total) by mouth daily. 06/06/22  Yes Darylene Price A, FNP  metoprolol succinate (TOPROL-XL) 50 MG 24 hr tablet Take 50 mg by mouth daily. Take with or immediately following a meal.   Yes [provider]  sacubitril-valsartan (ENTRESTO) 24-26 MG Take 1 tablet by mouth 2 (two) times daily. 06/06/22  Yes Darylene Price A, FNP  triamcinolone cream (KENALOG) 0.1 % Apply 1 application. topically 2 (two) times daily. 02/13/22  Yes Rada Hay, MD  albuterol (VENTOLIN HFA) 108 (90 Base) MCG/ACT inhaler Inhale 1-2 puffs into the lungs every 6 (six) hours as needed for wheezing or shortness of breath. Needed 1-2 times in past month Patient not taking: Reported on 09/04/2022    [provider]   Review of Systems  Constitutional:  Negative for appetite change and fatigue.  HENT:  Negative for congestion, postnasal drip and sore throat.   Eyes: Negative.   Respiratory:  Positive for cough (occasional). Negative for chest tightness and shortness of breath.   Cardiovascular:  Negative for chest pain,  palpitations and leg swelling.  Gastrointestinal:  Negative for abdominal distention and abdominal pain.  Endocrine: Negative.   Genitourinary: Negative.   Musculoskeletal:  Negative for back pain and neck pain.  Skin: Negative.   Allergic/Immunologic: Negative.   Neurological:  Negative for dizziness and light-headedness.  Hematological:  Negative for adenopathy. Does not bruise/bleed easily.  Psychiatric/Behavioral:  Negative for dysphoric mood and sleep disturbance. The patient is not nervous/anxious.    Vitals:   01/01/23 1133  BP: 116/82  Pulse: 66  SpO2: 98%  Weight: 227 lb 12.8 oz (103.3 kg)   Wt Readings from Last 3 Encounters:  01/01/23 227 lb 12.8 oz (103.3 kg)  09/04/22 229 lb 6 oz (104 kg)   06/06/22 252 lb (114.3 kg)   Lab Results  Component Value Date   CREATININE 0.78 09/04/2022   CREATININE 0.78 03/06/2022   CREATININE 0.83 02/19/2022   Physical Exam Vitals and nursing note reviewed. Exam conducted with a chaperone present (wife).  Constitutional:      Appearance: Normal appearance.  HENT:     Head: Normocephalic and atraumatic.  Cardiovascular:     Rate and Rhythm: Normal rate. Rhythm irregular.  Pulmonary:     Effort: Pulmonary effort is normal. No respiratory distress.     Breath sounds: No wheezing or rales.  Abdominal:     General: There is no distension.     Palpations: Abdomen is soft.     Tenderness: There is no abdominal tenderness.  Musculoskeletal:        General: No tenderness.     Cervical back: Normal range of motion and neck supple.     Right lower leg: Edema (trace pitting) present.     Left lower leg: Edema (trace pitting) present.  Neurological:     General: No focal deficit present.     Mental Status: He is alert and oriented to person, place, and time.  Psychiatric:        Mood and Affect: Mood normal.        Behavior: Behavior normal.        Thought Content: Thought content normal.   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)- - NYHA class I - euvolemic today - weighing daily; reminded to call for an overnight weight gain of > 2 pounds or a weekly weight gain of > 5 pounds - weight down 2 pounds from last visit here 4 months ago - echo 02/26/22: EF of 55-60% along with mild LVH/LAE. Echo 01/29/13:  reviewed and showed an EF of 55-60% along with mild LAE. - continue entresto 24/26mg  BID - has been hesitant to start anything else - furosemide 40mg  daily - metoprolol succinate 50mg  daily - not adding salt to his food and trying to be mindful of sodium content of foods; also watching sugar/carb intake - wearing compression socks daily with removal at bedtime  - doing  more walking for exercise - BNP 02/13/22 was 187.2  2: HTN- - BP 116/82 - saw PCP Daniel Underwood @ [facility]) 10/17/22 - BMP 09/04/22 reviewed and showed sodium 138, potassium 3.7, creatinine 0.78 & GFR >60  3: Atrial fibrillation- - apixaban 5mg  BID - metoprolol succinate 50mg  daily - saw cardiology ([facility]as[facility]) 10/30/22  4: Hyperlipidemia- - LDL 09/04/22 was 151 - has decreased consumption of eggs and other fatty foods - already ate breakfast today so lab order placed and advised him to return to the [facility] one day this week  or next when he's fasting   Medication list reviewed.   Return in 6 months, sooner if needed

## 2023-01-01 NOTE — Patient Instructions (Signed)
Go to the [facility] one day this week or next to get your lab work drawn. Make sure you don't eat breakfast the day you get it drawn.

## 2023-06-01 LAB — COLOGUARD: COLOGUARD: NEGATIVE

## 2023-06-01 LAB — EXTERNAL GENERIC LAB PROCEDURE: COLOGUARD: NEGATIVE

## 2023-07-02 ENCOUNTER — Encounter: Payer: Self-pay | Admitting: Family

## 2023-07-02 ENCOUNTER — Encounter: Payer: Self-pay | Admitting: Pharmacy Technician

## 2023-07-02 ENCOUNTER — Ambulatory Visit: Payer: PPO | Attending: Family | Admitting: Family

## 2023-07-02 VITALS — BP 104/60 | HR 67 | Wt 238.0 lb

## 2023-07-02 DIAGNOSIS — I4891 Unspecified atrial fibrillation: Secondary | ICD-10-CM | POA: Diagnosis not present

## 2023-07-02 DIAGNOSIS — J45909 Unspecified asthma, uncomplicated: Secondary | ICD-10-CM | POA: Diagnosis not present

## 2023-07-02 DIAGNOSIS — I480 Paroxysmal atrial fibrillation: Secondary | ICD-10-CM | POA: Diagnosis not present

## 2023-07-02 DIAGNOSIS — I110 Hypertensive heart disease with heart failure: Secondary | ICD-10-CM | POA: Insufficient documentation

## 2023-07-02 DIAGNOSIS — Z79899 Other long term (current) drug therapy: Secondary | ICD-10-CM | POA: Insufficient documentation

## 2023-07-02 DIAGNOSIS — I5032 Chronic diastolic (congestive) heart failure: Secondary | ICD-10-CM | POA: Insufficient documentation

## 2023-07-02 DIAGNOSIS — E785 Hyperlipidemia, unspecified: Secondary | ICD-10-CM | POA: Insufficient documentation

## 2023-07-02 DIAGNOSIS — Z7901 Long term (current) use of anticoagulants: Secondary | ICD-10-CM | POA: Insufficient documentation

## 2023-07-02 DIAGNOSIS — I10 Essential (primary) hypertension: Secondary | ICD-10-CM | POA: Diagnosis not present

## 2023-07-02 NOTE — Patient Instructions (Signed)
It was good to see you today. Keep up the good work!

## 2023-07-02 NOTE — Progress Notes (Signed)
PCP: Theora Gianotti, PA (last seen 07/24) Primary Cardiologist: Marcina Millard, MD (last seen 07/24)   HPI:  Daniel Underwood is a 77 y/o male with a history of asthma, HTN, atrial fibrillation and chronic heart failure.   Has not been admitted or been in the ED in the last 6 months.   Echo 01/29/13: EF 55-60% along with mild LAE. Echo 02/26/22: EF 55-60% along with mild LVH/LAE.   He presents today for a HF follow-up visit with a chief complaint of very little pedal edema. Chronic in nature. No other complaints and specifically denies fatigue, shortness of breath, chest pain, cough, palpitations, abdominal distention, dizziness, weight gain or difficulty sleeping. His wife notices that occasionally his color will be "off" and he will look pale. Patient denies having any symptoms when she asks him about this. Continues to wear compression socks daily.    ROS: All systems negative except as listed in HPI, PMH and Problem List.  SH:  Social History   Socioeconomic History   Marital status: Married    Spouse name: Daniel Underwood   Number of children: 2   Years of education: 12th grade   Highest education level: Not on file  Occupational History   Occupation: Paramedic  Tobacco Use   Smoking status: Never   Smokeless tobacco: Never  Vaping Use   Vaping status: Never Used  Substance and Sexual Activity   Alcohol use: No    Alcohol/week: 0.0 standard drinks of alcohol   Drug use: No   Sexual activity: Not on file  Other Topics Concern   Not on file  Social History Narrative   Married 2014 to [place]. Previous marriage ended in divorce.   2 adult daughters.   Exercise: No   Education: [employer].   Social Determinants of Health   Financial Resource Strain: Low Risk  (10/14/2022)   Received from [facility]   Overall Financial Resource Strain (CARDIA)    Difficulty of Paying Living Expenses: Not hard at all  Food Insecurity: No Food Insecurity (10/14/2022)   Received from [facility]   Hunger Vital Sign    Worried About Running Out of Food in the Last Year: Never true    Ran Out of Food in the Last Year: Never true  Transportation Needs: No Transportation Needs (10/14/2022)   Received from [facility] - Transportation    Lack of Transportation (Medical): No    Lack of Transportation (Non-Medical): No  Physical Activity: Insufficiently Active (10/14/2022)   Received from [facility]   Exercise Vital Sign    Days of Exercise per Week: 4 days    Minutes of Exercise per Session: 30 min  Stress: No Stress Concern Present (10/14/2022)   Received from [facility] - Occupational Stress Questionnaire    Feeling of Stress : Not at all  Social Connections: Socially Integrated (10/14/2022)   Received from [facility]   Social Network    How would you rate your social network (family, work, friends)?: Good participation with social networks  Intimate Partner Violence: Not At Risk (10/14/2022)   Received from [facility]   HITS    Over the last 12 months how often did your partner physically hurt you?: 1    Over the last 12 months how often did your partner insult you or talk down to you?: 1    Over the last 12 months how often did your partner threaten you  with physical harm?: 1    Over the last 12 months how often did your partner scream or curse at you?: 1    FH:  Family History  Problem Relation Age of Onset   Hypertension Mother    Heart disease Mother    Cancer Father        Lung   Cancer Brother        brain   Cancer Daughter        Lung/Bone   CAD Brother 2    Past Medical History:  Diagnosis Date   Allergy    Asthma    Atrial fibrillation (HCC)    CHF (congestive heart failure) (HCC)    Hypertension     Current Outpatient Medications  Medication Sig Dispense Refill   ADVAIR DISKUS 250-50 MCG/DOSE AEPB INHALE 1 PUFF TWICE DAILY. 60 each 0   albuterol (VENTOLIN HFA) 108 (90 Base)  MCG/ACT inhaler Inhale 1-2 puffs into the lungs every 6 (six) hours as needed for wheezing or shortness of breath. Needed 1-2 times in past month (Patient not taking: Reported on 09/04/2022)     apixaban (ELIQUIS) 5 MG TABS tablet Take 5 mg by mouth 2 (two) times daily.     fluticasone (FLONASE) 50 MCG/ACT nasal spray Place 2 sprays into both nostrils daily. (Patient taking differently: Place 1 spray into both nostrils daily.) 16 g 12   furosemide (LASIX) 40 MG tablet Take 1 tablet (40 mg total) by mouth daily. 90 tablet 3   metoprolol succinate (TOPROL-XL) 50 MG 24 hr tablet Take 50 mg by mouth daily. Take with or immediately following a meal.     sacubitril-valsartan (ENTRESTO) 24-26 MG Take 1 tablet by mouth 2 (two) times daily. 180 tablet 3   triamcinolone cream (KENALOG) 0.1 % Apply 1 application. topically 2 (two) times daily. 30 g 0   No current facility-administered medications for this visit.   Vitals:   07/02/23 0959  BP: 104/60  Pulse: 67  SpO2: 98%  Weight: 238 lb (108 kg)   Wt Readings from Last 3 Encounters:  07/02/23 238 lb (108 kg)  01/01/23 227 lb 12.8 oz (103.3 kg)  09/04/22 229 lb 6 oz (104 kg)   Lab Results  Component Value Date   CREATININE 0.78 09/04/2022   CREATININE 0.78 03/06/2022   CREATININE 0.83 02/19/2022   PHYSICAL EXAM:  General:  Well appearing. No resp difficulty HEENT: normal Neck: supple. JVP flat. No lymphadenopathy or thryomegaly appreciated. Cor: PMI normal. Bradycardic & irregular rhythm. No rubs, gallops or murmurs. Lungs: clear Abdomen: soft, nontender, nondistended. No hepatosplenomegaly. No bruits or masses.  Extremities: no cyanosis, clubbing, rash, edema Neuro: alert & orientedx3, cranial nerves grossly intact. Moves all 4 extremities w/o difficulty. Affect pleasant.   ECG: atrial fibrillation and brady with HR 53   ASSESSMENT & PLAN:  1: Chronic heart failure with preserved ejection fraction with structural changes (LAE)- -  NYHA class I - euvolemic today - weighing daily; reminded to call for an overnight weight gain of > 2 pounds or a weekly weight gain of > 5 pounds - weight up 11 pounds from last visit here 6 months ago - echo 01/29/13: EF 55-60% along with mild LAE. - echo 02/26/22: EF 55-60% along with mild LVH/LAE.  - continue entresto 24/26mg  BID; will message cardiology about applying for grants for entresto - has been hesitant to start anything else - continue furosemide 40mg  daily - continue metoprolol succinate 50mg  daily - not adding  salt to his food and trying to be mindful of sodium content of foods; also watching sugar/carb intake - wearing compression socks daily with removal at bedtime  - doing more walking for exercise - BNP 02/13/22 was 187.2  2: HTN- - BP 104/60 - saw PCP (Chelle) 07/24 - BMP 04/24/23 reviewed and showed sodium 141, potassium 4.0, creatinine 0.94 & GFR 82  3: Atrial fibrillation- - continue apixaban 5mg  BID - continue metoprolol succinate 50mg  daily - saw cardiology ([facility]aschos) 07/24 - EKG today showed AF with HR 53  4: Hyperlipidemia- - LDL 04/24/23 was 128 (improving)  Offered to make further appts PRN but they prefer to continue coming every 6 months. Advised to call us sooner if any issues/ questions come up.

## 2023-07-03 ENCOUNTER — Other Ambulatory Visit: Payer: Self-pay | Admitting: Family

## 2024-01-01 ENCOUNTER — Telehealth: Payer: Self-pay | Admitting: Family

## 2024-01-01 ENCOUNTER — Encounter: Payer: PPO | Admitting: Family

## 2024-01-01 NOTE — Progress Notes (Unsigned)
 [facility] Note   Referring Physician: PCP: Delma Freeze, FNP PCP-Cardiologist: None   Chief Complaint:   HPI:  Mr Nordahl is a 78 y/o male with a history of asthma, HTN, atrial fibrillation and chronic heart failure.   Echo 02/26/22: EF of 55-60% along with mild LVH/LAE. Echo 01/29/13:  reviewed and showed an EF of 55-60% along with mild LAE.  Has not been admitted or been in the ED in the last 6 months.   He presents today for a HF follow-up visit with a chief complaint of a dry cough. Says this is chronic although occurs on an intermittent basis. Has no other symptoms and specifically denies difficulty sleeping, dizziness, abdominal distention, palpitations, pedal edema, chest pain, SOB, fatigue or weight gain.   Has decreased his consumption of eggs and is more closely monitoring intake of fatty foods.      Review of Systems: [y] = yes, [ ]  = no   General: Weight gain [ ] ; Weight loss [ ] ; Anorexia [ ] ; Fatigue [ ] ; Fever [ ] ; Chills [ ] ; Weakness [ ]   Cardiac: Chest pain/pressure [ ] ; Resting SOB [ ] ; Exertional SOB [ ] ; Orthopnea [ ] ; Pedal Edema [ ] ; Palpitations [ ] ; Syncope [ ] ; Presyncope [ ] ; Paroxysmal nocturnal dyspnea[ ]   Pulmonary: Cough [ ] ; Wheezing[ ] ; Hemoptysis[ ] ; Sputum [ ] ; Snoring [ ]   GI: Vomiting[ ] ; Dysphagia[ ] ; Melena[ ] ; Hematochezia [ ] ; Heartburn[ ] ; Abdominal pain [ ] ; Constipation [ ] ; Diarrhea [ ] ; BRBPR [ ]   GU: Hematuria[ ] ; Dysuria [ ] ; Nocturia[ ]   Vascular: Pain in legs with walking [ ] ; Pain in feet with lying flat [ ] ; Non-healing sores [ ] ; Stroke [ ] ; TIA [ ] ; Slurred speech [ ] ;  Neuro: Headaches[ ] ; Vertigo[ ] ; Seizures[ ] ; Paresthesias[ ] ;Blurred vision [ ] ; Diplopia [ ] ; Vision changes [ ]   Ortho/Skin: Arthritis [ ] ; Joint pain [ ] ; Muscle pain [ ] ; Joint swelling [ ] ; Back Pain [ ] ; Rash [ ]   Psych: Depression[ ] ; Anxiety[ ]   Heme: Bleeding problems [ ] ; Clotting disorders [ ] ; Anemia [ ]   Endocrine: Diabetes [ ] ;  Thyroid dysfunction[ ]    Past Medical History:  Diagnosis Date   Allergy    Asthma    Atrial fibrillation (HCC)    CHF (congestive heart failure) (HCC)    Hypertension     Current Outpatient Medications  Medication Sig Dispense Refill   ADVAIR DISKUS 250-50 MCG/DOSE AEPB INHALE 1 PUFF TWICE DAILY. 60 each 0   albuterol (VENTOLIN HFA) 108 (90 Base) MCG/ACT inhaler Inhale 1-2 puffs into the lungs every 6 (six) hours as needed for wheezing or shortness of breath. Needed 1-2 times in past month (Patient not taking: Reported on 09/04/2022)     apixaban (ELIQUIS) 5 MG TABS tablet Take 5 mg by mouth 2 (two) times daily.     ENTRESTO 24-26 MG Take 1 tablet by mouth 2 (two) times daily. 180 tablet 0   fluticasone (FLONASE) 50 MCG/ACT nasal spray Place 2 sprays into both nostrils daily. (Patient taking differently: Place 1 spray into both nostrils daily.) 16 g 12   furosemide (LASIX) 40 MG tablet Take 1 tablet (40 mg total) by mouth daily. 90 tablet 3   metoprolol succinate (TOPROL-XL) 50 MG 24 hr tablet Take 50 mg by mouth daily. Take with or immediately following a meal.     triamcinolone cream (KENALOG) 0.1 % Apply 1 application. topically 2 (  two) times daily. 30 g 0   No current facility-administered medications for this visit.    Allergies  Allergen Reactions   Naproxen Sodium Other (See Comments)    Sweating and rapid heart beat      Social History   Socioeconomic History   Marital status: Married    Spouse name: Inetta Fermo   Number of children: 2   Years of education: 12th grade   Highest education level: Not on file  Occupational History   Occupation: Paramedic  Tobacco Use   Smoking status: Never   Smokeless tobacco: Never  Vaping Use   Vaping status: Never Used  Substance and Sexual Activity   Alcohol use: No    Alcohol/week: 0.0 standard drinks of alcohol   Drug use: No   Sexual activity: Not on file  Other Topics Concern   Not on file  Social History Narrative    Married 2014 to [place]. Previous marriage ended in divorce.   2 adult daughters.   Exercise: No   Education: [employer].   Social Drivers of Corporate investment banker Strain: Low Risk  (04/23/2023)   Received from [facility]   Overall Financial Resource Strain (CARDIA)    Difficulty of Paying Living Expenses: Not hard at all  Food Insecurity: No Food Insecurity (04/23/2023)   Received from [facility]   Hunger Vital Sign    Worried About Running Out of Food in the Last Year: Never true    Ran Out of Food in the Last Year: Never true  Transportation Needs: No Transportation Needs (04/23/2023)   Received from [facility] - Transportation    Lack of Transportation (Medical): No    Lack of Transportation (Non-Medical): No  Physical Activity: Unknown (04/23/2023)   Received from [facility]   Exercise Vital Sign    Days of Exercise per Week: 0 days    Minutes of Exercise per Session: Not on file  Recent Concern: Physical Activity - Inactive (04/23/2023)   Received from [facility]   Exercise Vital Sign    Days of Exercise per Week: 0 days    Minutes of Exercise per Session: 30 min  Stress: No Stress Concern Present (04/23/2023)   Received from [facility] - Occupational Stress Questionnaire    Feeling of Stress : Not at all  Social Connections: Socially Integrated (04/23/2023)   Received from [facility]   Social Network    How would you rate your social network (family, work, friends)?: Good participation with social networks  Intimate Partner Violence: Not At Risk (04/23/2023)   Received from [facility]   HITS    Over the last 12 months how often did your partner physically hurt you?: Never    Over the last 12 months how often did your partner insult you or talk down to you?: Never    Over the last 12 months how often did your partner threaten you with physical harm?: Never    Over the last 12 months how  often did your partner scream or curse at you?: Never      Family History  Problem Relation Age of Onset   Hypertension Mother    Heart disease Mother    Cancer Father        Lung   Cancer Brother        brain   Cancer Daughter        Lung/Bone  CAD Brother 75    There were no vitals filed for this visit.   PHYSICAL EXAM: General:  Well appearing. No respiratory difficulty HEENT: normal Neck: supple. no JVD. Carotids 2+ bilat; no bruits. No lymphadenopathy or thyromegaly appreciated. Cor: PMI nondisplaced. Regular rate & rhythm. No rubs, gallops or murmurs. Lungs: clear Abdomen: soft, nontender, nondistended. No hepatosplenomegaly. No bruits or masses. Good bowel sounds. Extremities: no cyanosis, clubbing, rash, edema Neuro: alert & oriented x 3, cranial nerves grossly intact. moves all 4 extremities w/o difficulty. Affect pleasant.  ECG:   ASSESSMENT & PLAN:  1: Chronic heart failure with preserved ejection fraction with structural changes (LAE)- - NYHA class I - euvolemic today - weighing daily; reminded to call for an overnight weight gain of > 2 pounds or a weekly weight gain of > 5 pounds - weight up 11 pounds from last visit here 6 months ago - echo 01/29/13: EF 55-60% along with mild LAE. - echo 02/26/22: EF 55-60% along with mild LVH/LAE.  - continue entresto 24/26mg  BID; will message cardiology about applying for grants for entresto - has been hesitant to start anything else - continue furosemide 40mg  daily - continue metoprolol succinate 50mg  daily - not adding salt to his food and trying to be mindful of sodium content of foods; also watching sugar/carb intake - wearing compression socks daily with removal at bedtime  - doing more walking for exercise - BNP 02/13/22 was 187.2  2: HTN- - BP 104/60 - saw PCP (Chelle) 07/24 - BMP 04/24/23 reviewed and showed sodium 141, potassium 4.0, creatinine 0.94 & GFR 82  3: Atrial fibrillation- - continue apixaban  5mg  BID - continue metoprolol succinate 50mg  daily - saw cardiology ([facility]aschos) 07/24 - EKG today showed AF with HR 53  4: Hyperlipidemia- - LDL 04/24/23 was 128 (improving)  Offered to make further appts PRN but they prefer to continue coming every 6 months. Advised to call us sooner if any issues/ questions come up.    Delma Freeze, FNP 01/01/24

## 2024-01-01 NOTE — Telephone Encounter (Incomplete)
 Called to confirm/remind patient of their appointment at the [facility] on 01/02/24***.   Appointment:   [] Confirmed  [x] Left mess   [] No answer/No voice mail  [] Phone not in service  Patient reminded to bring all medications and/or complete list.  Confirmed patient has transportation. Gave directions, instructed to utilize valet parking.

## 2024-01-02 ENCOUNTER — Encounter: Payer: Self-pay | Admitting: Family

## 2024-01-02 ENCOUNTER — Ambulatory Visit: Payer: PPO | Attending: Family | Admitting: Family

## 2024-01-02 VITALS — BP 126/70 | HR 68 | Wt 249.4 lb

## 2024-01-02 DIAGNOSIS — I480 Paroxysmal atrial fibrillation: Secondary | ICD-10-CM

## 2024-01-02 DIAGNOSIS — I110 Hypertensive heart disease with heart failure: Secondary | ICD-10-CM | POA: Insufficient documentation

## 2024-01-02 DIAGNOSIS — I509 Heart failure, unspecified: Secondary | ICD-10-CM | POA: Diagnosis present

## 2024-01-02 DIAGNOSIS — J45909 Unspecified asthma, uncomplicated: Secondary | ICD-10-CM | POA: Diagnosis not present

## 2024-01-02 DIAGNOSIS — E785 Hyperlipidemia, unspecified: Secondary | ICD-10-CM | POA: Diagnosis not present

## 2024-01-02 DIAGNOSIS — I5032 Chronic diastolic (congestive) heart failure: Secondary | ICD-10-CM | POA: Diagnosis not present

## 2024-01-02 DIAGNOSIS — I4891 Unspecified atrial fibrillation: Secondary | ICD-10-CM | POA: Insufficient documentation

## 2024-01-02 DIAGNOSIS — I10 Essential (primary) hypertension: Secondary | ICD-10-CM | POA: Diagnosis not present

## 2024-01-02 DIAGNOSIS — I493 Ventricular premature depolarization: Secondary | ICD-10-CM | POA: Diagnosis not present

## 2024-01-02 DIAGNOSIS — Z7901 Long term (current) use of anticoagulants: Secondary | ICD-10-CM | POA: Diagnosis not present

## 2024-01-02 NOTE — Progress Notes (Signed)
 [facility] - HEART FAILURE CLINIC - PHARMACIST COUNSELING NOTE  Adherence Assessment  Do you ever forget to take your medication? [] Yes [x] No  Do you ever skip doses due to side effects? [] Yes [x] No  Do you have trouble affording your medicines? [] Yes [x] No  Are you ever unable to pick up your medication due to transportation difficulties? [] Yes [x] No  Do you ever stop taking your medications because you don't believe they are helping? [] Yes [x] No  Do you check your weight daily? [] Yes [x] No  Do you check your blood pressure daily?  [] Yes [x] No  Adherence strategy: No pill box but uses a little bag    Barriers to obtaining medications: None reported    Vital signs: HR ***, BP ***, weight (pounds) *** ECHO: Date ***, EF ***, notes *** Cath: Date ***, EF ***, notes *** Renal function: Date ***, GFR ***  Current Guideline-Directed Medical Therapy/Evidence Based Medicine  ACE/ARB/ARNI: {AR_ARNI/ACEi/ARB:25599} Target dose:  Beta Blocker: {AR_Beta blocker:25598} Target dose:  Aldosterone Antagonist: {AR_Mineralocorticoid receptor antagonists:25602} Target dose:  SGLT2i: {AR_SGLT2i:25603} Target dose:   Diuretic: {AR_Diuretics:25604}  Others:   ASSESSMENT *** year old {Gender Description:210950033} who presents to the HF clinic ***. PMH is significant for ***.   Recent ED visit or hospitalization (past 6 months): Date - ***, CC - *** , Admission Dx (if applicable) - ***    COUNSELING POINTS/CLINICAL PEARLS  Can use the HFCMEDCOUNSELING dot phrase here  PLAN    Time spent: 20 minutes   Littie Deeds, PharmD Pharmacy Resident  01/02/2024 8:55 AM

## 2024-01-02 NOTE — Patient Instructions (Signed)
 Call us in the future if you need [place] for anything.   If you receive a satisfaction survey regarding the Heart Failure Clinic, please take the time to fill it out. This way we can continue to provide excellent care and make any changes that need to be made.

## 2024-08-21 ENCOUNTER — Encounter: Payer: Self-pay | Admitting: Ophthalmology

## 2024-08-21 NOTE — Discharge Instructions (Signed)

## 2024-08-21 NOTE — Anesthesia Preprocedure Evaluation (Addendum)
 Anesthesia Evaluation  Patient identified by MRN, date of birth, ID band Patient awake    Reviewed: Allergy & Precautions, H&P , NPO status , Patient's Chart, lab work & pertinent test results  Airway Mallampati: III  TM Distance: <3 FB Neck ROM: Full    Dental no notable dental hx.    Pulmonary neg pulmonary ROS, asthma    Pulmonary exam normal breath sounds clear to auscultation       Cardiovascular hypertension, +CHF  Normal cardiovascular exam+ dysrhythmias  Rhythm:Irregular Rate:Normal  02-26-22 echo 1. Left ventricular ejection fraction, by estimation, is 55 to 60%. The  left ventricle has normal function. The left ventricle has no regional  wall motion abnormalities. There is mild left ventricular hypertrophy.  Left ventricular diastolic parameters  are indeterminate.   2. Right ventricular systolic function is normal. The right ventricular  size is moderately enlarged.   3. Left atrial size was mildly dilated.   4. Right atrial size was mildly dilated.   5. The mitral valve is normal in structure. No evidence of mitral valve  regurgitation. No evidence of mitral stenosis.   6. The aortic valve is normal in structure. Aortic valve regurgitation is  not visualized. No aortic stenosis is present.   7. The inferior vena cava is normal in size with greater than 50%  respiratory variability, suggesting right atrial pressure of 3 mmHg.     Neuro/Psych negative neurological ROS  negative psych ROS   GI/Hepatic negative GI ROS, Neg liver ROS,,,  Endo/Other  negative endocrine ROS    Renal/GU negative Renal ROS  negative genitourinary   Musculoskeletal negative musculoskeletal ROS (+)    Abdominal   Peds negative pediatric ROS (+)  Hematology negative hematology ROS (+) Blood dyscrasia, anemia   Anesthesia Other Findings Asthma  Atrial fibrillation (HCC) Allergy  Hypertension CHF (congestive heart failure)    Dysrhythmia History of kidney stones  Cancer     Reproductive/Obstetrics negative OB ROS                              Anesthesia Physical Anesthesia Plan  ASA: 3  Anesthesia Plan: MAC   Post-op Pain Management:    Induction: Intravenous  PONV Risk Score and Plan:   Airway Management Planned: Natural Airway and Nasal Cannula  Additional Equipment:   Intra-op Plan:   Post-operative Plan:   Informed Consent: I have reviewed the patients History and Physical, chart, labs and discussed the procedure including the risks, benefits and alternatives for the proposed anesthesia with the patient or authorized representative who has indicated his/her understanding and acceptance.     Dental Advisory Given  Plan Discussed with: Anesthesiologist, CRNA and Surgeon  Anesthesia Plan Comments: (Patient consented for risks of anesthesia including but not limited to:  - adverse reactions to medications - damage to eyes, teeth, lips or other oral mucosa - nerve damage due to positioning  - sore throat or hoarseness - Damage to heart, brain, nerves, lungs, other parts of body or loss of life  Patient voiced understanding and assent.)         Anesthesia Quick Evaluation

## 2024-08-25 ENCOUNTER — Ambulatory Visit
Admission: RE | Admit: 2024-08-25 | Discharge: 2024-08-25 | Disposition: A | Discharge status: Home / Self Care | Attending: Ophthalmology | Admitting: Ophthalmology

## 2024-08-25 ENCOUNTER — Encounter
Admission: RE | Disposition: A | Discharge status: Home / Self Care | Payer: Self-pay | Source: Home / Self Care | Attending: Ophthalmology

## 2024-08-25 ENCOUNTER — Encounter: Payer: Self-pay | Admitting: Ophthalmology

## 2024-08-25 ENCOUNTER — Ambulatory Visit: Payer: Self-pay | Admitting: Anesthesiology

## 2024-08-25 ENCOUNTER — Other Ambulatory Visit: Payer: Self-pay

## 2024-08-25 DIAGNOSIS — E66811 Obesity, class 1: Secondary | ICD-10-CM | POA: Insufficient documentation

## 2024-08-25 DIAGNOSIS — Z7951 Long term (current) use of inhaled steroids: Secondary | ICD-10-CM | POA: Diagnosis not present

## 2024-08-25 DIAGNOSIS — I482 Chronic atrial fibrillation, unspecified: Secondary | ICD-10-CM | POA: Insufficient documentation

## 2024-08-25 DIAGNOSIS — J45909 Unspecified asthma, uncomplicated: Secondary | ICD-10-CM | POA: Insufficient documentation

## 2024-08-25 DIAGNOSIS — Z6835 Body mass index (BMI) 35.0-35.9, adult: Secondary | ICD-10-CM | POA: Insufficient documentation

## 2024-08-25 DIAGNOSIS — I110 Hypertensive heart disease with heart failure: Secondary | ICD-10-CM | POA: Diagnosis not present

## 2024-08-25 DIAGNOSIS — Z7901 Long term (current) use of anticoagulants: Secondary | ICD-10-CM | POA: Diagnosis not present

## 2024-08-25 DIAGNOSIS — Z79899 Other long term (current) drug therapy: Secondary | ICD-10-CM | POA: Insufficient documentation

## 2024-08-25 DIAGNOSIS — I5032 Chronic diastolic (congestive) heart failure: Secondary | ICD-10-CM | POA: Diagnosis not present

## 2024-08-25 DIAGNOSIS — H2512 Age-related nuclear cataract, left eye: Secondary | ICD-10-CM | POA: Diagnosis present

## 2024-08-25 DIAGNOSIS — D649 Anemia, unspecified: Secondary | ICD-10-CM | POA: Insufficient documentation

## 2024-08-25 HISTORY — DX: Personal history of urinary calculi: Z87.442

## 2024-08-25 HISTORY — DX: Chronic diastolic (congestive) heart failure: I50.32

## 2024-08-25 HISTORY — DX: Obesity, class 1: E66.811

## 2024-08-25 HISTORY — DX: Edema, unspecified: R60.9

## 2024-08-25 HISTORY — DX: Malignant (primary) neoplasm, unspecified: C80.1

## 2024-08-25 HISTORY — DX: Cardiac arrhythmia, unspecified: I49.9

## 2024-08-25 SURGERY — PHACOEMULSIFICATION, CATARACT, WITH IOL INSERTION
Anesthesia: Monitor Anesthesia Care | Laterality: Left

## 2024-08-25 MED ORDER — SIGHTPATH DOSE#1 BSS IO SOLN
INTRAOCULAR | Status: DC | PRN
  Administered 2024-08-25: 15 mL via INTRAOCULAR

## 2024-08-25 MED ORDER — TETRACAINE HCL 0.5 % OP SOLN
1.0000 [drp] | OPHTHALMIC | Status: DC | PRN
  Administered 2024-08-25 (×3): 1 [drp] via OPHTHALMIC

## 2024-08-25 MED ORDER — CYCLOPENTOLATE HCL 2 % OP SOLN
OPHTHALMIC | Status: AC
Start: 2024-08-25 — End: 2024-08-25
  Filled 2024-08-25: qty 2

## 2024-08-25 MED ORDER — SIGHTPATH DOSE#1 NA CHONDROIT SULF-NA HYALURON 40-17 MG/ML IO SOLN
INTRAOCULAR | Status: DC | PRN
  Administered 2024-08-25: 1 mL via INTRAOCULAR

## 2024-08-25 MED ORDER — TETRACAINE HCL 0.5 % OP SOLN
OPHTHALMIC | Status: AC
  Filled 2024-08-25: qty 4

## 2024-08-25 MED ORDER — LACTATED RINGERS IV SOLN: INTRAVENOUS | Status: DC

## 2024-08-25 MED ORDER — MOXIFLOXACIN HCL 0.5 % OP SOLN
OPHTHALMIC | Status: DC | PRN
  Administered 2024-08-25: .2 mL via OPHTHALMIC

## 2024-08-25 MED ORDER — BRIMONIDINE TARTRATE-TIMOLOL 0.2-0.5 % OP SOLN
OPHTHALMIC | Status: DC | PRN
  Administered 2024-08-25: 1 [drp] via OPHTHALMIC

## 2024-08-25 MED ORDER — FENTANYL CITRATE (PF) 100 MCG/2ML IJ SOLN
INTRAMUSCULAR | Status: DC | PRN
  Administered 2024-08-25: 50 ug via INTRAVENOUS

## 2024-08-25 MED ORDER — LIDOCAINE HCL (PF) 2 % IJ SOLN
INTRAOCULAR | Status: DC | PRN
  Administered 2024-08-25: 2 mL

## 2024-08-25 MED ORDER — PHENYLEPHRINE HCL 10 % OP SOLN
1.0000 [drp] | OPHTHALMIC | Status: AC
  Administered 2024-08-25 (×2): 1 [drp] via OPHTHALMIC

## 2024-08-25 MED ORDER — CYCLOPENTOLATE HCL 2 % OP SOLN
1.0000 [drp] | OPHTHALMIC | Status: AC
  Administered 2024-08-25 (×3): 1 [drp] via OPHTHALMIC

## 2024-08-25 MED ORDER — PHENYLEPHRINE HCL 10 % OP SOLN
OPHTHALMIC | Status: AC
Start: 2024-08-25 — End: 2024-08-25
  Filled 2024-08-25: qty 5

## 2024-08-25 MED ORDER — SIGHTPATH DOSE#1 BSS IO SOLN
INTRAOCULAR | Status: DC | PRN
  Administered 2024-08-25: 39 mL via OPHTHALMIC

## 2024-08-25 MED ORDER — FENTANYL CITRATE (PF) 100 MCG/2ML IJ SOLN
INTRAMUSCULAR | Status: AC
  Filled 2024-08-25: qty 2

## 2024-08-25 SURGICAL SUPPLY — 10 items
CANNULA ANT/CHMB 27G (MISCELLANEOUS) ×2 IMPLANT
CYSTOTOME ANGL RVRS SHRT 25G (CUTTER) ×2 IMPLANT
FEE CATARACT SUITE SIGHTPATH (MISCELLANEOUS) ×2 IMPLANT
GLOVE BIOGEL PI IND STRL 8 (GLOVE) ×2 IMPLANT
GLOVE SURG LX STRL 8.0 MICRO (GLOVE) ×2 IMPLANT
GLOVE SURG SYN 6.5 PF PI BL (GLOVE) ×2 IMPLANT
LENS IOL CLRN VT TRC 4 19.0 IMPLANT
NDL FILTER BLUNT 18X1 1/2 (NEEDLE) ×2 IMPLANT
NEEDLE FILTER BLUNT 18X1 1/2 (NEEDLE) ×1 IMPLANT
SYR 3ML LL SCALE MARK (SYRINGE) ×2 IMPLANT

## 2024-08-25 NOTE — Anesthesia Postprocedure Evaluation (Signed)
 Anesthesia Post Note  Patient: Daniel Underwood  Procedure(s) Performed: PHACOEMULSIFICATION, CATARACT, WITH IOL INSERTION 10.23 00:53.4 (Left)  Patient location during evaluation: PACU Anesthesia Type: MAC Level of consciousness: awake and alert Pain management: pain level controlled Vital Signs Assessment: post-procedure vital signs reviewed and stable Respiratory status: spontaneous breathing, nonlabored ventilation, respiratory function stable and patient connected to nasal cannula oxygen Cardiovascular status: stable and blood pressure returned to baseline Postop Assessment: no apparent nausea or vomiting Anesthetic complications: no   No notable events documented.   Last Vitals:  Vitals:   08/25/24 0927 08/25/24 0930  BP: 120/83 120/73  Pulse: 62 (!) 41  Resp:  17  Temp: 36.8 C   SpO2: 97% 97%    Last Pain:  Vitals:   08/25/24 0930  TempSrc:   PainSc: 0-No pain                 Ziana Heyliger C [place]

## 2024-08-25 NOTE — H&P (Signed)
 [facility]   Primary Care Physician:  Juliane Che, [place] Ophthalmologist: Dr. Elsie Carmine  Pre-Procedure History & Physical: HPI:  Daniel Underwood is a 78 y.o. male here for cataract surgery.   Past Medical History:  Diagnosis Date   1+ pitting edema    Allergy    Asthma    Atrial fibrillation (HCC)    Cancer (HCC)    skin cancer   CHF (congestive heart failure) (HCC)    Chronic atrial fibrillation (HCC) 2014   Chronic diastolic heart failure (HCC)    Class 1 obesity    Dysrhythmia    A-fib   History of kidney stones    many years ago   Hypertension     Past Surgical History:  Procedure Laterality Date   skin cancer, face     TONSILLECTOMY AND ADENOIDECTOMY      Prior to Admission medications   Medication Sig Start Date End Date Taking? Authorizing Provider  ADVAIR DISKUS 250-50 MCG/DOSE AEPB INHALE 1 PUFF TWICE DAILY. 02/25/18  Yes Jeffery, Chelle, PA  albuterol (VENTOLIN HFA) 108 (90 Base) MCG/ACT inhaler Inhale 1-2 puffs into the lungs every 6 (six) hours as needed for wheezing or shortness of breath. Needed 1-2 times in past month   Yes [provider]  apixaban (ELIQUIS) 5 MG TABS tablet Take 5 mg by mouth 2 (two) times daily.   Yes [provider]  ENTRESTO  24-26 MG Take 1 tablet by mouth 2 (two) times daily. 07/03/23  Yes Hackney, Tina A, FNP  fluticasone  (FLONASE ) 50 MCG/ACT nasal spray Place 2 sprays into both nostrils daily. Patient taking differently: Place 1 spray into both nostrils as needed. 01/23/17  Yes Jeffery, Che, PA  furosemide  (LASIX ) 40 MG tablet Take 1 tablet (40 mg total) by mouth daily. 06/06/22  Yes [place] A, FNP  metoprolol succinate (TOPROL-XL) 50 MG 24 hr tablet Take 50 mg by mouth daily. Take with or immediately following a meal.   Yes [provider]    Allergies as of 08/10/2024 - Review Complete 01/02/2024  Allergen Reaction Noted   Naproxen sodium Other (See Comments) 01/23/2012    Family  History  Problem Relation Age of Onset   Hypertension Mother    Heart disease Mother    Cancer Father        Lung   Cancer Brother        brain   Cancer Daughter        Lung/Bone   CAD Brother 83    Social History   Socioeconomic History   Marital status: Married    Spouse name: [place]   Number of children: 2   Years of education: 12th grade   Highest education level: Not on file  Occupational History   Occupation: Paramedic  Tobacco Use   Smoking status: Never   Smokeless tobacco: Never  Vaping Use   Vaping status: Never Used  Substance and Sexual Activity   Alcohol use: Yes    Comment: rarely   Drug use: Never   Sexual activity: Not on file  Other Topics Concern   Not on file  Social History Narrative   Married 2014 to [place]. Previous marriage ended in divorce.   2 adult daughters.   Exercise: No   Education: [employer].   Social Drivers of Corporate Investment Banker Strain: Low Risk  (06/30/2024)   Received from [facility]   Overall Financial Resource Strain (CARDIA)    How  hard is it for you to pay for the very basics like food, housing, medical care, and heating?: Not hard at all  Food Insecurity: No Food Insecurity (06/30/2024)   Received from [facility]   Hunger Vital Sign    Within the past 12 months, you worried that your food would run out before you got the money to buy more.: Never true    Within the past 12 months, the food you bought just didn't last and you didn't have money to get more.: Never true  Transportation Needs: No Transportation Needs (06/30/2024)   Received from [facility] - Transportation    In the past 12 months, has lack of transportation kept you from medical appointments or from getting medications?: No    In the past 12 months, has lack of transportation kept you from meetings, work, or from getting things needed for daily living?: No  Physical Activity: Insufficiently Active (06/30/2024)   Received from  [facility]   Exercise Vital Sign    On average, how many days per week do you engage in moderate to strenuous exercise (like a brisk walk)?: 2 days    On average, how many minutes do you engage in exercise at this level?: 20 min  Stress: No Stress Concern Present (06/30/2024)   Received from [facility] - Occupational Stress Questionnaire    Do you feel stress - tense, restless, nervous, or anxious, or unable to sleep at night because your mind is troubled all the time - these days?: Not at all  Social Connections: Socially Integrated (06/30/2024)   Received from [facility]   Social Network    How would you rate your social network (family, work, friends)?: Good participation with social networks  Intimate Partner Violence: Not At Risk (06/30/2024)   Received from [facility]   HITS    Over the last 12 months how often did your partner physically hurt you?: Never    Over the last 12 months how often did your partner insult you or talk down to you?: Never    Over the last 12 months how often did your partner threaten you with physical harm?: Never    Over the last 12 months how often did your partner scream or curse at you?: Never    Review of Systems: See HPI, otherwise negative ROS  Physical Exam: BP 132/87   Temp 97.7 F (36.5 C) (Temporal)   Resp 18   Ht 5' 10.98 (1.803 m)   Wt 113.9 kg   SpO2 98%   BMI 35.05 kg/m  General:   Alert, cooperative. Head:  Normocephalic and atraumatic. Respiratory:  Normal work of breathing. Cardiovascular:  NAD  Impression/Plan: Daniel Underwood is here for cataract surgery.  Risks, benefits, limitations, and alternatives regarding cataract surgery have been reviewed with the patient.  Questions have been answered.  All parties agreeable.   Elsie Carmine, MD  08/25/2024, 9:02 AM

## 2024-08-25 NOTE — Transfer of Care (Signed)
 Immediate Anesthesia Transfer of Care Note  Patient: Daniel Underwood  Procedure(s) Performed: PHACOEMULSIFICATION, CATARACT, WITH IOL INSERTION 10.23 00:53.4 (Left)  Patient Location: PACU  Anesthesia Type: MAC  Level of Consciousness: awake, alert  and patient cooperative  Airway and Oxygen Therapy: Patient Spontanous Breathing and Patient connected to supplemental oxygen  Post-op Assessment: Post-op Vital signs reviewed, Patient's Cardiovascular Status Stable, Respiratory Function Stable, Patent Airway and No signs of Nausea or vomiting  Post-op Vital Signs: Reviewed and stable  Complications: No notable events documented.

## 2024-08-25 NOTE — Op Note (Signed)
 PREOPERATIVE DIAGNOSIS:  Nuclear sclerotic cataract of the left eye.   POSTOPERATIVE DIAGNOSIS:  Nuclear sclerotic cataract of the left eye.   OPERATIVE PROCEDURE: Procedure(s): PHACOEMULSIFICATION, CATARACT, WITH IOL INSERTION 10.23 00:53.4   SURGEON:  Elsie Carmine, MD.   ANESTHESIA: 1.      Managed anesthesia care. 2.     0.53ml os Shugarcaine was instilled following the paracentesis [email or web address]   COMPLICATIONS:  None.   TECHNIQUE:   Stop and chop    DESCRIPTION OF PROCEDURE:  The patient was examined and consented in the preoperative holding area where the aforementioned topical anesthesia was applied to the left eye.  The patient was brought back to the Operating Room where he was sat upright on the gurney and given a target to fixate upon while the eye was marked at the 3:00 and 9:00 position.  The patient was then reclined on the operating table.  The eye was prepped and draped in the usual sterile ophthalmic fashion and a lid speculum was placed. A paracentesis was created with the side port blade and the anterior chamber was filled with viscoelastic. A near clear corneal incision was performed with the steel keratome. A continuous curvilinear capsulorrhexis was performed with a cystotome followed by the capsulorrhexis forceps. Hydrodissection and hydrodelineation were carried out with BSS on a blunt cannula. The lens was removed in a stop and chop technique and the remaining cortical material was removed with the irrigation-aspiration handpiece. The eye was inflated with viscoelastic and the intraocular lens was placed in the eye and rotated to within a few degrees of the predetermined orientation.  The remaining viscoelastic was removed from the eye.  The Sinskey hook was used to rotate the toric lens into its final resting place at 178 degrees.  0.1 ml of Vigamox was placed in the anterior chamber. The eye was inflated to a physiologic pressure and found to be watertight.  The eye  was dressed with Combigan. The patient was given protective glasses to wear throughout the day and a shield with which to sleep tonight. The patient was also given drops with which to begin a drop regimen today and will follow-up with me in one day. Implant Name Type Inv. Item Serial No. Manufacturer Lot No. LRB No. Used Action  LENS IOL CLRN VT TRC 4 19.0 - [id number]  LENS IOL CLRN VT TRC 4 19.0 [id number] [employer]  Left 1 Implanted   Procedure(s): PHACOEMULSIFICATION, CATARACT, WITH IOL INSERTION 10.23 00:53.4 (Left)  Electronically signed: Elsie Carmine 11/18/20259:25 AM

## 2024-09-01 NOTE — Anesthesia Preprocedure Evaluation (Addendum)
 Anesthesia Evaluation  Patient identified by MRN, date of birth, ID band Patient awake    Reviewed: Allergy & Precautions, H&P , NPO status , Patient's Chart, lab work & pertinent test results  Airway Mallampati: III  TM Distance: <3 FB Neck ROM: Full    Dental no notable dental hx.    Pulmonary neg pulmonary ROS, asthma    Pulmonary exam normal breath sounds clear to auscultation       Cardiovascular hypertension, +CHF  negative cardio ROS Normal cardiovascular exam+ dysrhythmias  Rhythm:Regular Rate:Normal  02-26-22 echo 1. Left ventricular ejection fraction, by estimation, is 55 to 60%. The  left ventricle has normal function. The left ventricle has no regional  wall motion abnormalities. There is mild left ventricular hypertrophy.  Left ventricular diastolic parameters  are indeterminate.   2. Right ventricular systolic function is normal. The right ventricular  size is moderately enlarged.   3. Left atrial size was mildly dilated.   4. Right atrial size was mildly dilated.   5. The mitral valve is normal in structure. No evidence of mitral valve  regurgitation. No evidence of mitral stenosis.   6. The aortic valve is normal in structure. Aortic valve regurgitation is  not visualized. No aortic stenosis is present.   7. The inferior vena cava is normal in size with greater than 50%  respiratory variability, suggesting right atrial pressure of 3 mmHg.       Neuro/Psych negative neurological ROS  negative psych ROS   GI/Hepatic negative GI ROS, Neg liver ROS,,,  Endo/Other  negative endocrine ROS    Renal/GU negative Renal ROS  negative genitourinary   Musculoskeletal negative musculoskeletal ROS (+)    Abdominal   Peds negative pediatric ROS (+)  Hematology negative hematology ROS (+) Blood dyscrasia, anemia   Anesthesia Other Findings Previous cataract surgery 08-25-24 Dr. Ola  Asthma Atrial  fibrillation [facility]) Allergy Hypertension CHF (congestive heart failure) (HCC) Dysrhythmia History of kidney stones Cancer (HCC) Chronic diastolic heart failure (HCC) Chronic atrial fibrillation (HCC) Class 1 obesity 1+ pitting edema    Reproductive/Obstetrics negative OB ROS                              Anesthesia Physical Anesthesia Plan  ASA: 3  Anesthesia Plan: MAC   Post-op Pain Management:    Induction: Intravenous  PONV Risk Score and Plan:   Airway Management Planned: Natural Airway and Nasal Cannula  Additional Equipment:   Intra-op Plan:   Post-operative Plan:   Informed Consent: I have reviewed the patients History and Physical, chart, labs and discussed the procedure including the risks, benefits and alternatives for the proposed anesthesia with the patient or authorized representative who has indicated his/her understanding and acceptance.     Dental Advisory Given  Plan Discussed with: Anesthesiologist, CRNA and Surgeon  Anesthesia Plan Comments: (Patient consented for risks of anesthesia including but not limited to:  - adverse reactions to medications - damage to eyes, teeth, lips or other oral mucosa - nerve damage due to positioning  - sore throat or hoarseness - Damage to heart, brain, nerves, lungs, other parts of body or loss of life  Patient voiced understanding and assent.)         Anesthesia Quick Evaluation

## 2024-09-07 NOTE — Discharge Instructions (Signed)

## 2024-09-08 ENCOUNTER — Encounter
Admission: RE | Disposition: A | Discharge status: Home / Self Care | Payer: Self-pay | Source: Home / Self Care | Attending: Ophthalmology

## 2024-09-08 ENCOUNTER — Other Ambulatory Visit: Payer: Self-pay

## 2024-09-08 ENCOUNTER — Encounter: Payer: Self-pay | Admitting: Ophthalmology

## 2024-09-08 ENCOUNTER — Ambulatory Visit: Payer: Self-pay | Admitting: Anesthesiology

## 2024-09-08 ENCOUNTER — Ambulatory Visit
Admission: RE | Admit: 2024-09-08 | Discharge: 2024-09-08 | Disposition: A | Discharge status: Home / Self Care | Attending: Ophthalmology | Admitting: Ophthalmology

## 2024-09-08 SURGERY — PHACOEMULSIFICATION, CATARACT, WITH IOL INSERTION
Anesthesia: Monitor Anesthesia Care | Site: Eye | Laterality: Right

## 2024-09-08 MED ORDER — PHENYLEPHRINE HCL 10 % OP SOLN
OPHTHALMIC | Status: AC
  Filled 2024-09-08: qty 5

## 2024-09-08 MED ORDER — FENTANYL CITRATE (PF) 100 MCG/2ML IJ SOLN
INTRAMUSCULAR | Status: AC
  Filled 2024-09-08: qty 2

## 2024-09-08 MED ORDER — MIDAZOLAM HCL 2 MG/2ML IJ SOLN
INTRAMUSCULAR | Status: AC
  Filled 2024-09-08: qty 2

## 2024-09-08 MED ORDER — MIDAZOLAM HCL (PF) 2 MG/2ML IJ SOLN
INTRAMUSCULAR | Status: DC | PRN
  Administered 2024-09-08: 1 mg via INTRAVENOUS

## 2024-09-08 MED ORDER — BRIMONIDINE TARTRATE-TIMOLOL 0.2-0.5 % OP SOLN
OPHTHALMIC | Status: DC | PRN
  Administered 2024-09-08: 1 [drp] via OPHTHALMIC

## 2024-09-08 MED ORDER — CYCLOPENTOLATE HCL 2 % OP SOLN
1.0000 [drp] | OPHTHALMIC | Status: AC
  Administered 2024-09-08 (×3): 1 [drp] via OPHTHALMIC

## 2024-09-08 MED ORDER — PHENYLEPHRINE HCL 10 % OP SOLN
1.0000 [drp] | OPHTHALMIC | Status: AC
  Administered 2024-09-08 (×3): 1 [drp] via OPHTHALMIC

## 2024-09-08 MED ORDER — SIGHTPATH DOSE#1 BSS IO SOLN
INTRAOCULAR | Status: DC | PRN
  Administered 2024-09-08: 15 mL via INTRAOCULAR

## 2024-09-08 MED ORDER — LACTATED RINGERS IV SOLN: INTRAVENOUS | Status: DC

## 2024-09-08 MED ORDER — TETRACAINE HCL 0.5 % OP SOLN
1.0000 [drp] | OPHTHALMIC | Status: DC | PRN
  Administered 2024-09-08 (×3): 1 [drp] via OPHTHALMIC

## 2024-09-08 MED ORDER — FENTANYL CITRATE (PF) 100 MCG/2ML IJ SOLN
INTRAMUSCULAR | Status: DC | PRN
  Administered 2024-09-08: 50 ug via INTRAVENOUS

## 2024-09-08 MED ORDER — SIGHTPATH DOSE#1 NA CHONDROIT SULF-NA HYALURON 40-17 MG/ML IO SOLN
INTRAOCULAR | Status: DC | PRN
  Administered 2024-09-08: 1 mL via INTRAOCULAR

## 2024-09-08 MED ORDER — CYCLOPENTOLATE HCL 2 % OP SOLN
OPHTHALMIC | Status: AC
  Filled 2024-09-08: qty 2

## 2024-09-08 MED ORDER — TETRACAINE HCL 0.5 % OP SOLN
OPHTHALMIC | Status: AC
  Filled 2024-09-08: qty 4

## 2024-09-08 MED ORDER — SIGHTPATH DOSE#1 BSS IO SOLN
INTRAOCULAR | Status: DC | PRN
  Administered 2024-09-08: 67 mL via OPHTHALMIC

## 2024-09-08 MED ORDER — LIDOCAINE HCL (PF) 2 % IJ SOLN
INTRAOCULAR | Status: DC | PRN
  Administered 2024-09-08: 2 mL

## 2024-09-08 MED ORDER — MOXIFLOXACIN HCL 0.5 % OP SOLN
OPHTHALMIC | Status: DC | PRN
  Administered 2024-09-08: .2 mL via OPHTHALMIC

## 2024-09-08 SURGICAL SUPPLY — 10 items
CANNULA ANT/CHMB 27G (MISCELLANEOUS) ×2 IMPLANT
CYSTOTOME ANGL RVRS SHRT 25G (CUTTER) ×2 IMPLANT
FEE CATARACT SUITE SIGHTPATH (MISCELLANEOUS) ×2 IMPLANT
GLOVE BIOGEL PI IND STRL 8 (GLOVE) ×2 IMPLANT
GLOVE SURG LX STRL 8.0 MICRO (GLOVE) ×2 IMPLANT
GLOVE SURG SYN 6.5 PF PI BL (GLOVE) ×2 IMPLANT
LENS IOL CLRN VT TRC 4 19.0 IMPLANT
NDL FILTER BLUNT 18X1 1/2 (NEEDLE) ×2 IMPLANT
SYR 3ML LL SCALE MARK (SYRINGE) ×2 IMPLANT
TIP ITREPID SGL USE BENT I/A (SUCTIONS) IMPLANT

## 2024-09-08 NOTE — H&P (Signed)
 [facility]   Primary Care Physician:  Juliane Che, [place] Ophthalmologist: Dr. Elsie Carmine  Pre-Procedure History & Physical: HPI:  Daniel Underwood is a 79 y.o. male here for cataract surgery.   Past Medical History:  Diagnosis Date   1+ pitting edema    Allergy    Asthma    Atrial fibrillation (HCC)    Cancer (HCC)    skin cancer   CHF (congestive heart failure) (HCC)    Chronic atrial fibrillation (HCC) 2014   Chronic diastolic heart failure (HCC)    Class 1 obesity    Dysrhythmia    A-fib   History of kidney stones    many years ago   Hypertension     Past Surgical History:  Procedure Laterality Date   CATARACT EXTRACTION W/PHACO Left 08/25/2024   Procedure: PHACOEMULSIFICATION, CATARACT, WITH IOL INSERTION 10.23 00:53.4;  Surgeon: Carmine Elsie, MD;  Location: [facility];  Service: Ophthalmology;  Laterality: Left;   skin cancer, face     TONSILLECTOMY AND ADENOIDECTOMY      Prior to Admission medications   Medication Sig Start Date End Date Taking? Authorizing Provider  ADVAIR DISKUS 250-50 MCG/DOSE AEPB INHALE 1 PUFF TWICE DAILY. 02/25/18  Yes Jeffery, Chelle, PA  albuterol (VENTOLIN HFA) 108 (90 Base) MCG/ACT inhaler Inhale 1-2 puffs into the lungs every 6 (six) hours as needed for wheezing or shortness of breath. Needed 1-2 times in past month   Yes [provider]  apixaban (ELIQUIS) 5 MG TABS tablet Take 5 mg by mouth 2 (two) times daily.   Yes [provider]  ENTRESTO  24-26 MG Take 1 tablet by mouth 2 (two) times daily. 07/03/23  Yes Hackney, Ellouise A, FNP  fluticasone  (FLONASE ) 50 MCG/ACT nasal spray Place 2 sprays into both nostrils daily. Patient taking differently: Place 1 spray into both nostrils as needed. 01/23/17  Yes Jeffery, Che, PA  furosemide  (LASIX ) 40 MG tablet Take 1 tablet (40 mg total) by mouth daily. 06/06/22  Yes Donette Ellouise A, FNP  metoprolol succinate (TOPROL-XL) 50 MG 24 hr tablet Take 50 mg by  mouth daily. Take with or immediately following a meal.   Yes [provider]    Allergies as of 08/10/2024 - Review Complete 01/02/2024  Allergen Reaction Noted   Naproxen sodium Other (See Comments) 01/23/2012    Family History  Problem Relation Age of Onset   Hypertension Mother    Heart disease Mother    Cancer Father        Lung   Cancer Brother        brain   Cancer Daughter        Lung/Bone   CAD Brother 63    Social History   Socioeconomic History   Marital status: Married    Spouse name: Ellouise   Number of children: 2   Years of education: 12th grade   Highest education level: Not on file  Occupational History   Occupation: Paramedic  Tobacco Use   Smoking status: Never   Smokeless tobacco: Never  Vaping Use   Vaping status: Never Used  Substance and Sexual Activity   Alcohol use: Yes    Comment: rarely   Drug use: Never   Sexual activity: Not on file  Other Topics Concern   Not on file  Social History Narrative   Married 2014 to [place]. Previous marriage ended in divorce.   2 adult daughters.   Exercise: No   Education: High  School.   Social Drivers of Corporate Investment Banker Strain: Low Risk  (06/30/2024)   Received from [facility]   Overall Financial Resource Strain (CARDIA)    How hard is it for you to pay for the very basics like food, housing, medical care, and heating?: Not hard at all  Food Insecurity: No Food Insecurity (06/30/2024)   Received from [facility]   Hunger Vital Sign    Within the past 12 months, you worried that your food would run out before you got the money to buy more.: Never true    Within the past 12 months, the food you bought just didn't last and you didn't have money to get more.: Never true  Transportation Needs: No Transportation Needs (06/30/2024)   Received from [facility] - Transportation    In the past 12 months, has lack of transportation kept you from medical appointments or  from getting medications?: No    In the past 12 months, has lack of transportation kept you from meetings, work, or from getting things needed for daily living?: No  Physical Activity: Insufficiently Active (06/30/2024)   Received from [facility]   Exercise Vital Sign    On average, how many days per week do you engage in moderate to strenuous exercise (like a brisk walk)?: 2 days    On average, how many minutes do you engage in exercise at this level?: 20 min  Stress: No Stress Concern Present (06/30/2024)   Received from [facility] - Occupational Stress Questionnaire    Do you feel stress - tense, restless, nervous, or anxious, or unable to sleep at night because your mind is troubled all the time - these days?: Not at all  Social Connections: Socially Integrated (06/30/2024)   Received from [facility]   Social Network    How would you rate your social network (family, work, friends)?: Good participation with social networks  Intimate Partner Violence: Not At Risk (06/30/2024)   Received from [facility]   HITS    Over the last 12 months how often did your partner physically hurt you?: Never    Over the last 12 months how often did your partner insult you or talk down to you?: Never    Over the last 12 months how often did your partner threaten you with physical harm?: Never    Over the last 12 months how often did your partner scream or curse at you?: Never    Review of Systems: See HPI, otherwise negative ROS  Physical Exam: BP (!) 128/95   Pulse 68   Temp 98.7 F (37.1 C) (Temporal)   Resp 15   Ht 5' 10.98 (1.803 m)   Wt 113.9 kg   SpO2 97%   BMI 35.03 kg/m  General:   Alert, cooperative. Head:  Normocephalic and atraumatic. Respiratory:  Normal work of breathing. Cardiovascular:  NAD  Impression/Plan: Daniel Underwood is here for cataract surgery.  Risks, benefits, limitations, and alternatives regarding cataract  surgery have been reviewed with the patient.  Questions have been answered.  All parties agreeable.   Elsie Carmine, MD  09/08/2024, 9:24 AM

## 2024-09-08 NOTE — Transfer of Care (Signed)
 Immediate Anesthesia Transfer of Care Note  Patient: Daniel Underwood  Procedure(s) Performed: PHACOEMULSIFICATION, CATARACT, WITH IOL INSERTION 21.08 01:37.8 (Right: Eye)  Patient Location: PACU  Anesthesia Type: MAC  Level of Consciousness: awake, alert  and patient cooperative  Airway and Oxygen Therapy: Patient Spontanous Breathing and Patient connected to supplemental oxygen  Post-op Assessment: Post-op Vital signs reviewed, Patient's Cardiovascular Status Stable, Respiratory Function Stable, Patent Airway and No signs of Nausea or vomiting  Post-op Vital Signs: Reviewed and stable  Complications: No notable events documented.

## 2024-09-08 NOTE — Anesthesia Postprocedure Evaluation (Signed)
 Anesthesia Post Note  Patient: HAIDAN NHAN  Procedure(s) Performed: PHACOEMULSIFICATION, CATARACT, WITH IOL INSERTION 21.08 01:37.8 (Right: Eye)  Patient location during evaluation: PACU Anesthesia Type: MAC Level of consciousness: awake and alert Pain management: pain level controlled Vital Signs Assessment: post-procedure vital signs reviewed and stable Respiratory status: spontaneous breathing, nonlabored ventilation, respiratory function stable and patient connected to nasal cannula oxygen Cardiovascular status: blood pressure returned to baseline and stable Postop Assessment: no apparent nausea or vomiting Anesthetic complications: no   No notable events documented.   Last Vitals:  Vitals:   09/08/24 0956 09/08/24 0957  BP: 111/61   Pulse: 76 (!) 32  Resp: 19 15  Temp:    SpO2: 95% 96%    Last Pain:  Vitals:   09/08/24 0956  TempSrc:   PainSc: 0-No pain                 Donny BROCKS Masen Luallen

## 2024-09-08 NOTE — Op Note (Signed)
 PREOPERATIVE DIAGNOSIS:  Nuclear sclerotic cataract of the right eye.   POSTOPERATIVE DIAGNOSIS:  Nuclear sclerotic cataract of the right eye.   OPERATIVE PROCEDURE: Procedure(s): PHACOEMULSIFICATION, CATARACT, WITH IOL INSERTION 21.08 01:37.8   SURGEON:  Elsie Carmine, MD.   ANESTHESIA: 1.      Managed anesthesia care. 2.     0.66ml of Shugarcaine was instilled following the paracentesis  Anesthesiologist: Ola Donny BROCKS, MD CRNA: Jahoo, Sonia, CRNA  COMPLICATIONS:  None.   TECHNIQUE:   Stop and chop    DESCRIPTION OF PROCEDURE:  The patient was examined and consented in the preoperative holding area where the aforementioned topical anesthesia was applied to the right eye.  The patient was brought back to the Operating Room where he was sat upright on the gurney and given a target to fixate upon while the eye was marked at the 3:00 and 9:00 position.  The patient was then reclined on the operating table.  The eye was prepped and draped in the usual sterile ophthalmic fashion and a lid speculum was placed. A paracentesis was created with the side port blade and the anterior chamber was filled with viscoelastic. A near clear corneal incision was performed with the steel keratome. A continuous curvilinear capsulorrhexis was performed with a cystotome followed by the capsulorrhexis forceps. Hydrodissection and hydrodelineation were carried out with BSS on a blunt cannula. The lens was removed in a stop and chop technique and the remaining cortical material was removed with the irrigation-aspiration handpiece. The eye was inflated with viscoelastic and the intraocular  lens  was placed in the eye and rotated to within a few degrees of the predetermined orientation.  The remaining viscoelastic was removed from the eye.  The Sinskey hook was used to rotate the toric lens into its final resting place at 009 degrees.  0. The eye was inflated to a physiologic pressure and found to be watertight. 0.51ml  of Vigamox  was placed in the anterior chamber.  The eye was dressed with Combigan . The patient was given protective glasses to wear throughout the day and a shield with which to sleep tonight. The patient was also given drops with which to begin a drop regimen today and will follow-up with me in one day. Implant Name Type Inv. Item Serial No. Manufacturer Lot No. LRB No. Used Action  LENS IOL CLRN VT TRC 4 19.0 - [id number]  LENS IOL CLRN VT TRC 4 19.0 [id number] [employer]  Right 1 Implanted   Procedure(s): PHACOEMULSIFICATION, CATARACT, WITH IOL INSERTION 21.08 01:37.8 (Right)  Electronically signed: Elsie Carmine 09/08/2024 9:50 AM
